# Patient Record
Sex: Female | Born: 1935
Health system: Southern US, Community
[De-identification: ages and names within clinical notes are randomized; demographics above are authoritative.]

## PROBLEM LIST (undated history)

## (undated) DIAGNOSIS — M5136 Other intervertebral disc degeneration, lumbar region: Secondary | ICD-10-CM

## (undated) DIAGNOSIS — F17200 Nicotine dependence, unspecified, uncomplicated: Secondary | ICD-10-CM

## (undated) DIAGNOSIS — E559 Vitamin D deficiency, unspecified: Secondary | ICD-10-CM

## (undated) DIAGNOSIS — E039 Hypothyroidism, unspecified: Secondary | ICD-10-CM

## (undated) DIAGNOSIS — I6523 Occlusion and stenosis of bilateral carotid arteries: Secondary | ICD-10-CM

## (undated) DIAGNOSIS — F329 Major depressive disorder, single episode, unspecified: Secondary | ICD-10-CM

## (undated) DIAGNOSIS — F0280 Dementia in other diseases classified elsewhere without behavioral disturbance: Secondary | ICD-10-CM

## (undated) DIAGNOSIS — R569 Unspecified convulsions: Secondary | ICD-10-CM

## (undated) DIAGNOSIS — E46 Unspecified protein-calorie malnutrition: Secondary | ICD-10-CM

## (undated) DIAGNOSIS — N3941 Urge incontinence: Secondary | ICD-10-CM

## (undated) DIAGNOSIS — E782 Mixed hyperlipidemia: Secondary | ICD-10-CM

## (undated) DIAGNOSIS — I771 Stricture of artery: Secondary | ICD-10-CM

## (undated) DIAGNOSIS — E538 Deficiency of other specified B group vitamins: Secondary | ICD-10-CM

## (undated) DIAGNOSIS — K219 Gastro-esophageal reflux disease without esophagitis: Secondary | ICD-10-CM

## (undated) DIAGNOSIS — M25569 Pain in unspecified knee: Secondary | ICD-10-CM

## (undated) DIAGNOSIS — G459 Transient cerebral ischemic attack, unspecified: Secondary | ICD-10-CM

## (undated) DIAGNOSIS — M40204 Unspecified kyphosis, thoracic region: Secondary | ICD-10-CM

## (undated) DIAGNOSIS — G8929 Other chronic pain: Secondary | ICD-10-CM

## (undated) DIAGNOSIS — D692 Other nonthrombocytopenic purpura: Secondary | ICD-10-CM

## (undated) HISTORY — DX: Other chronic pain: G89.29

## (undated) HISTORY — PX: TONSILLECTOMY: SUR1361

## (undated) HISTORY — DX: Other chronic pain: M25.569

## (undated) HISTORY — DX: Other intervertebral disc degeneration, lumbar region: M51.36

## (undated) HISTORY — DX: Urge incontinence: N39.41

## (undated) HISTORY — DX: Mixed hyperlipidemia: E78.2

## (undated) HISTORY — DX: Dementia in other diseases classified elsewhere, unspecified severity, without behavioral disturbance, psychotic disturbance, mood disturbance, and anxiety: F02.80

## (undated) HISTORY — DX: Other nonthrombocytopenic purpura: D69.2

## (undated) HISTORY — DX: Vitamin D deficiency, unspecified: E55.9

## (undated) HISTORY — DX: Occlusion and stenosis of bilateral carotid arteries: I65.23

## (undated) HISTORY — DX: Stricture of artery: I77.1

## (undated) HISTORY — DX: Deficiency of other specified B group vitamins: E53.8

## (undated) HISTORY — DX: Nicotine dependence, unspecified, uncomplicated: F17.200

## (undated) HISTORY — DX: Hypothyroidism, unspecified: E03.9

## (undated) HISTORY — DX: Unspecified protein-calorie malnutrition: E46

## (undated) HISTORY — DX: Gastro-esophageal reflux disease without esophagitis: K21.9

## (undated) HISTORY — DX: Unspecified convulsions: R56.9

## (undated) HISTORY — DX: Major depressive disorder, single episode, unspecified: F32.9

## (undated) HISTORY — DX: Unspecified kyphosis, thoracic region: M40.204

---

## 2003-09-29 ENCOUNTER — Encounter: Admission: RE | Admit: 2003-09-29 | Discharge: 2003-09-29 | Payer: Self-pay | Admitting: Emergency Medicine

## 2004-02-04 ENCOUNTER — Encounter: Admission: RE | Admit: 2004-02-04 | Discharge: 2004-02-04 | Payer: Self-pay | Admitting: Family Medicine

## 2004-12-05 ENCOUNTER — Encounter: Admission: RE | Admit: 2004-12-05 | Discharge: 2004-12-05 | Payer: Self-pay | Admitting: Family Medicine

## 2004-12-13 ENCOUNTER — Encounter: Admission: RE | Admit: 2004-12-13 | Discharge: 2004-12-13 | Payer: Self-pay | Admitting: Family Medicine

## 2005-06-26 ENCOUNTER — Encounter: Admission: RE | Admit: 2005-06-26 | Discharge: 2005-06-26 | Payer: Self-pay | Admitting: Family Medicine

## 2005-08-27 ENCOUNTER — Observation Stay (HOSPITAL_COMMUNITY): Admission: EM | Admit: 2005-08-27 | Discharge: 2005-08-28 | Payer: Self-pay | Admitting: Emergency Medicine

## 2006-09-07 IMAGING — CR DG THORACIC SPINE 3V
3 series · 3 of 3 positions shown · non-contrast
Comparison: none

CLINICAL DATA: Mid to low back and left hip pain. 
 LUMBAR SPINE ? 4 VIEW:

[t t-spine a.p.]
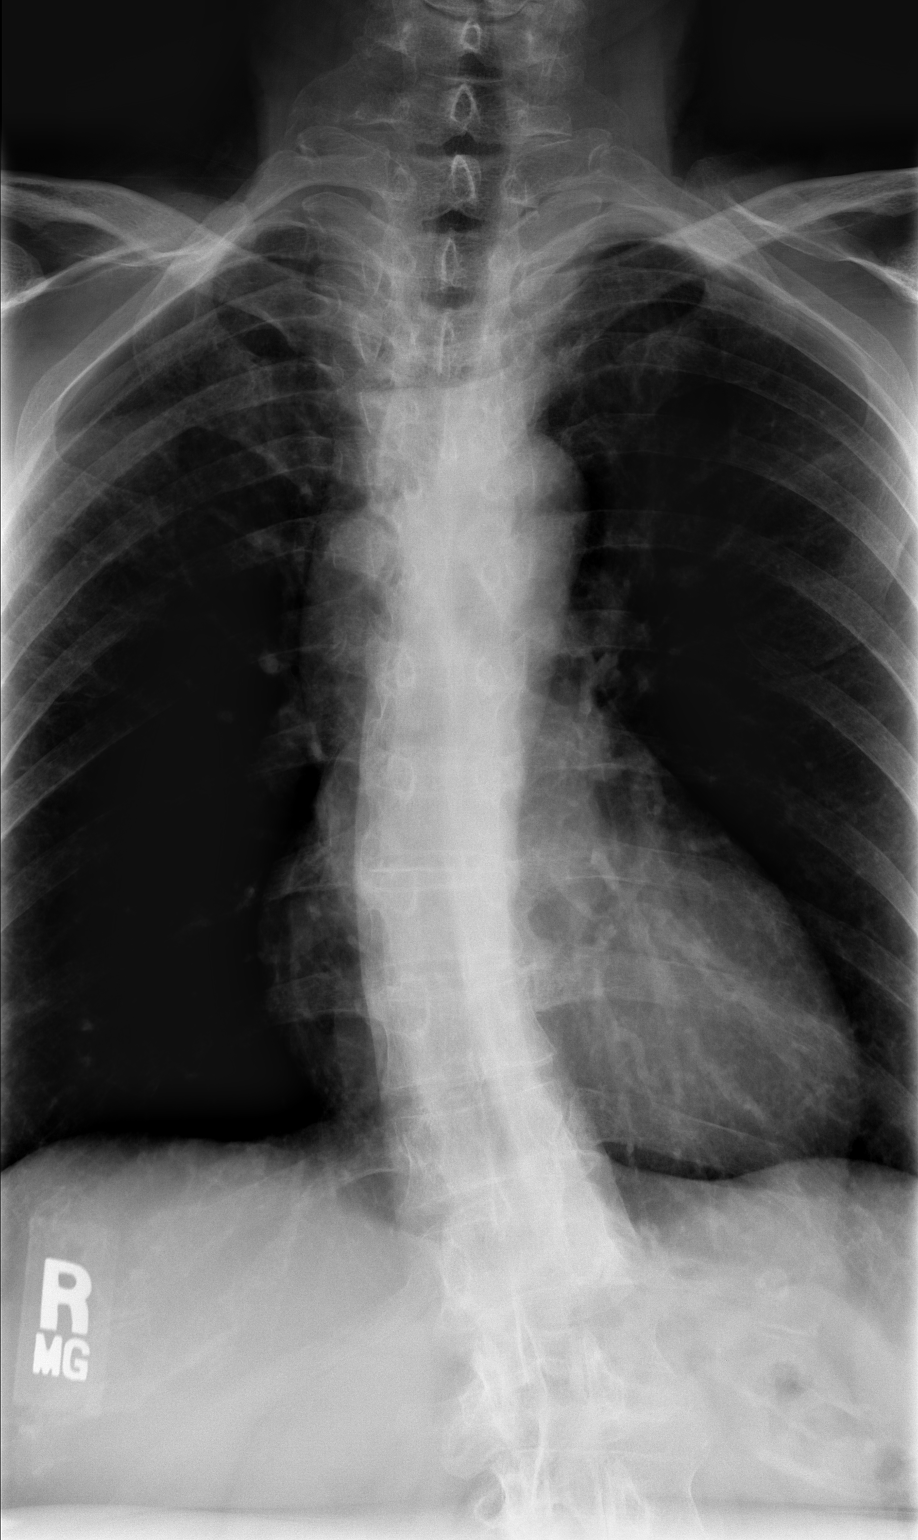

[t t-spine lat *]
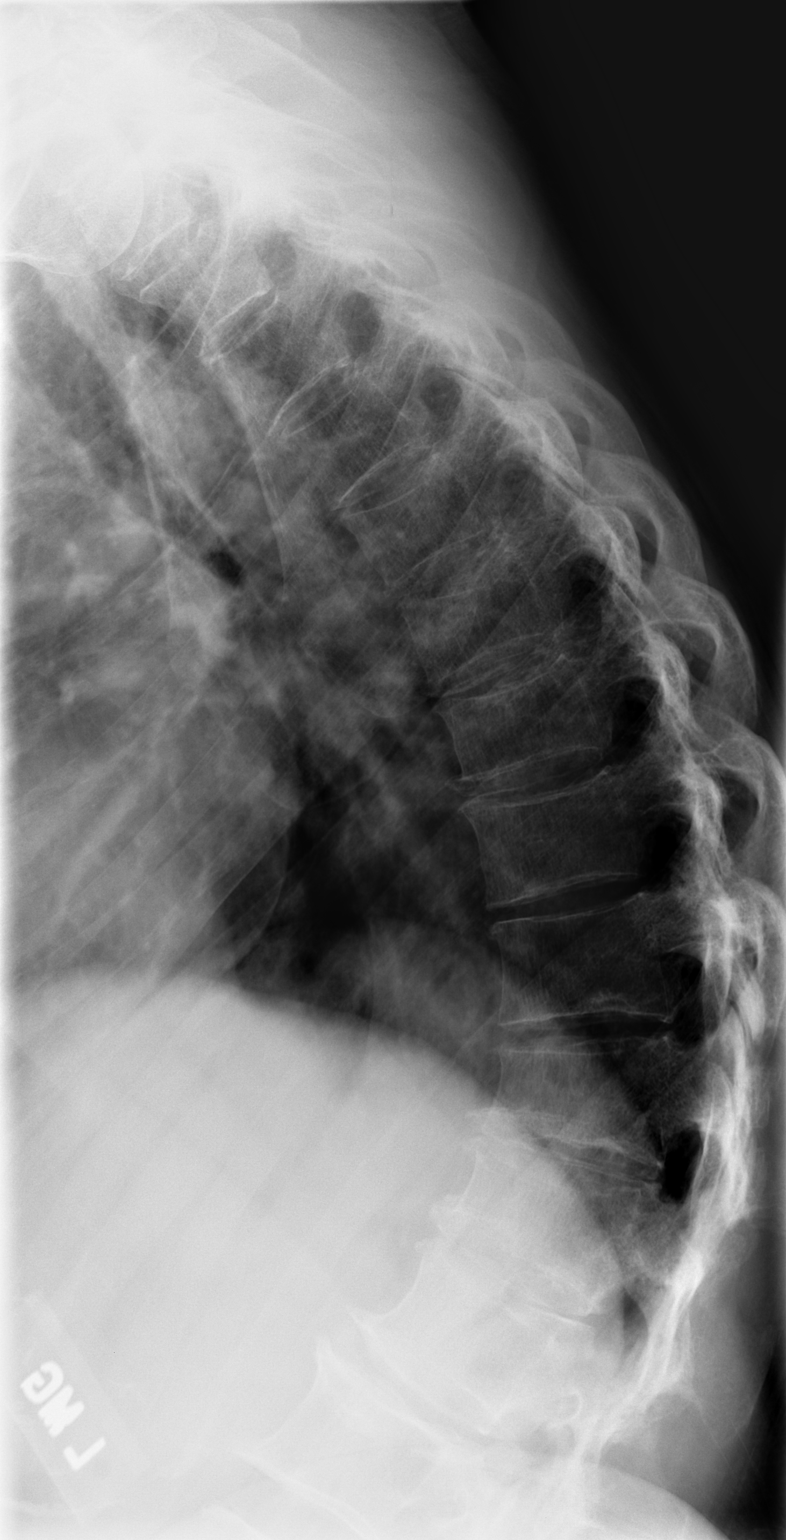

[t swimmers]
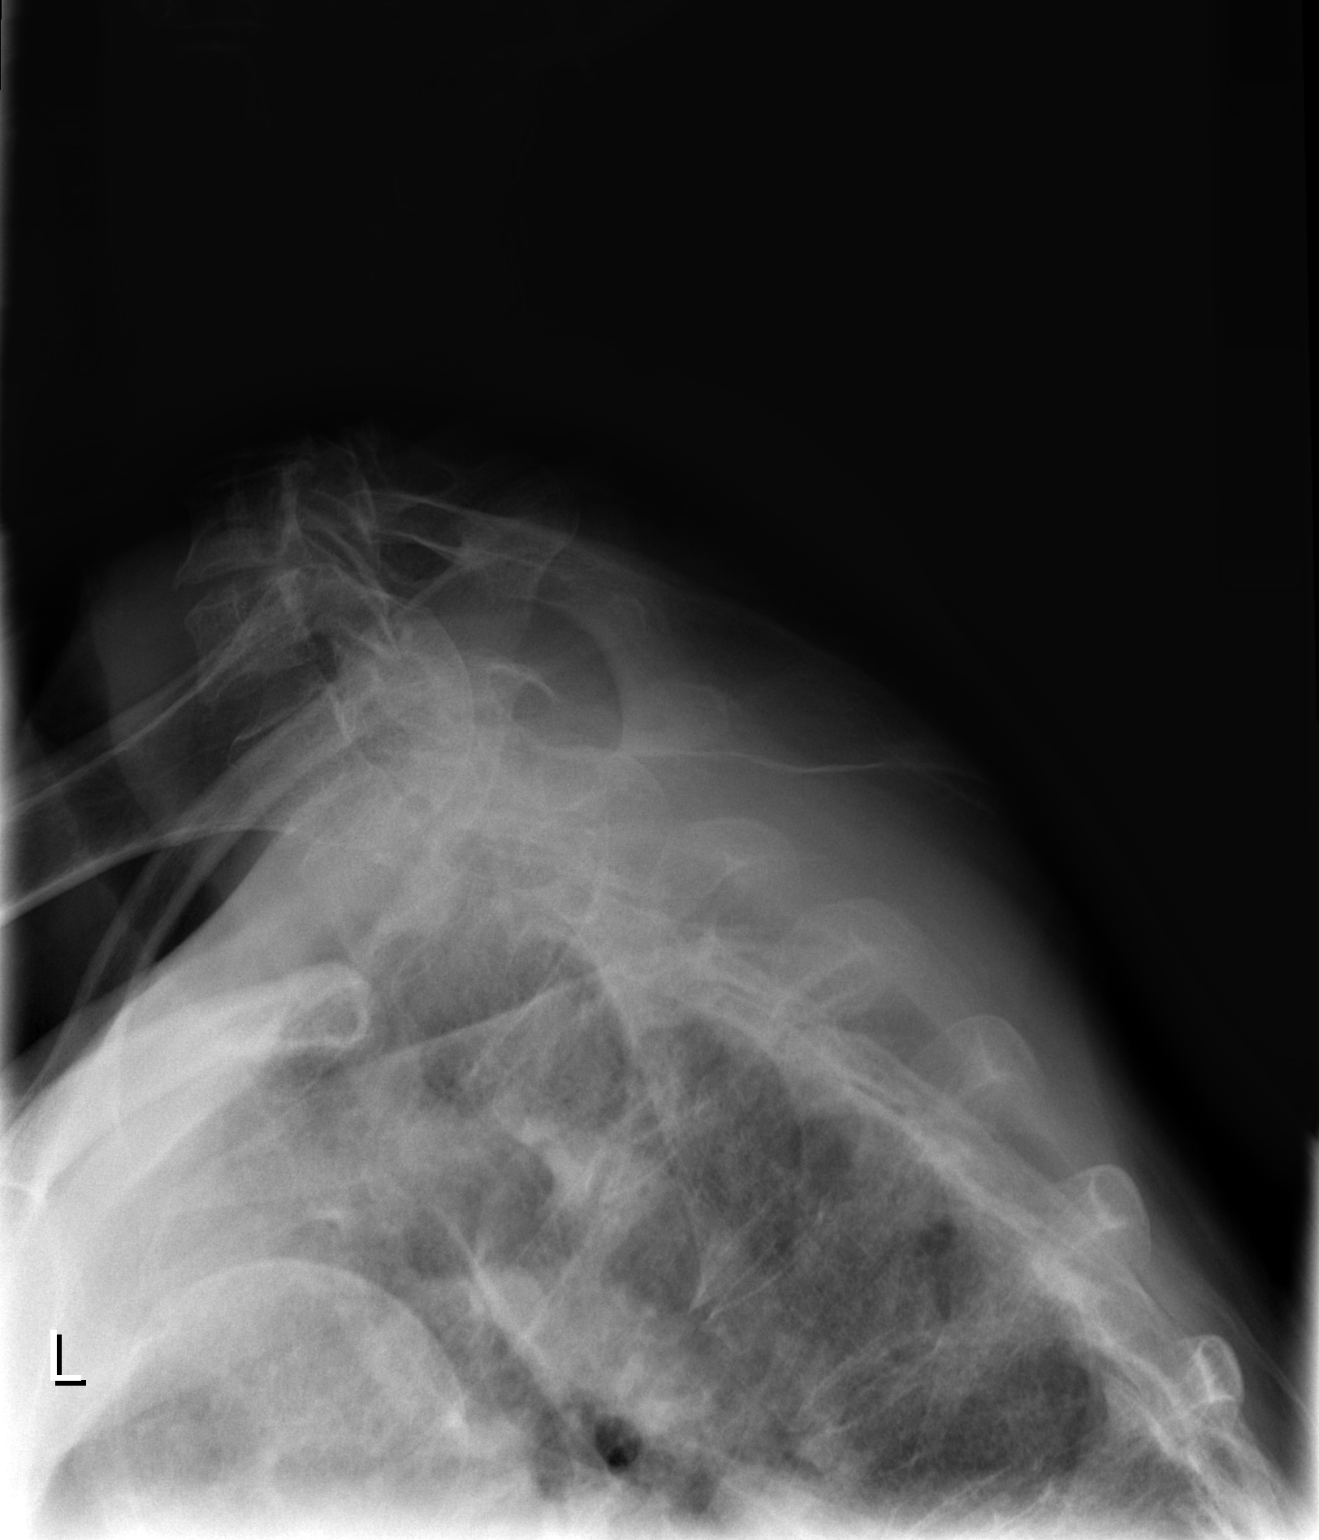

[3 of 3 positions shown; findings below may reference images not displayed]

FINDINGS: There is a rather marked levoscoliosis with a rotatory component in the upper lumbar region.  I do not see any obvious acute compression fractures or significantly narrowed disc spaces, although that assessment is difficult in the setting of scoliosis.  There is some moderate degenerative spurring of the upper lumbar vertebrae.  There is also at least some degree of facet arthropathy. 
 Soft tissues unremarkable.  The bones are mildly demineralized. 
 There are also some degenerative changes of the SI joints and of the right hip.  These are relatively mild.
IMPRESSION: 1.  Levoscoliosis with a rotary component in the upper lumbar region. 
 2.  Degenerative changes. 
 3.  No acute abnormality. 
 THORACIC SPINE - 2 VIEW:
 AP, lateral, and swimmers views were obtained.  There is scoliosis to the right.  No compression fractures or acute changes.  There are some degenerative changes of the lower thoracic spine with spurring and slight anterior wedging.   The pedicles are intact.  I see no obvious paraspinous masses.
IMPRESSION: Scoliosis with degenerative changes ? no acute findings.

## 2007-03-27 ENCOUNTER — Encounter: Admission: RE | Admit: 2007-03-27 | Discharge: 2007-03-27 | Payer: Self-pay | Admitting: Family Medicine

## 2007-04-10 ENCOUNTER — Encounter: Admission: RE | Admit: 2007-04-10 | Discharge: 2007-04-10 | Payer: Self-pay | Admitting: Family Medicine

## 2008-10-06 ENCOUNTER — Encounter: Admission: RE | Admit: 2008-10-06 | Discharge: 2008-10-06 | Payer: Self-pay | Admitting: Family Medicine

## 2009-05-14 ENCOUNTER — Encounter: Admission: RE | Admit: 2009-05-14 | Discharge: 2009-05-14 | Payer: Self-pay | Admitting: Family Medicine

## 2009-08-22 ENCOUNTER — Emergency Department (HOSPITAL_COMMUNITY): Admission: EM | Admit: 2009-08-22 | Discharge: 2009-08-22 | Payer: Self-pay | Admitting: Emergency Medicine

## 2009-11-18 ENCOUNTER — Encounter: Admission: RE | Admit: 2009-11-18 | Discharge: 2009-11-18 | Payer: Self-pay | Admitting: Family Medicine

## 2010-07-09 ENCOUNTER — Encounter: Payer: Self-pay | Admitting: Family Medicine

## 2010-07-10 ENCOUNTER — Encounter: Payer: Self-pay | Admitting: Family Medicine

## 2010-08-05 ENCOUNTER — Other Ambulatory Visit: Payer: Self-pay | Admitting: Family Medicine

## 2010-08-05 DIAGNOSIS — Z1231 Encounter for screening mammogram for malignant neoplasm of breast: Secondary | ICD-10-CM

## 2010-08-15 ENCOUNTER — Ambulatory Visit
Admission: RE | Admit: 2010-08-15 | Discharge: 2010-08-15 | Disposition: A | Discharge status: Home / Self Care | Payer: MEDICARE | Source: Ambulatory Visit | Attending: Family Medicine | Admitting: Family Medicine

## 2010-08-15 DIAGNOSIS — Z1231 Encounter for screening mammogram for malignant neoplasm of breast: Secondary | ICD-10-CM

## 2010-08-31 ENCOUNTER — Other Ambulatory Visit: Payer: Self-pay | Admitting: Family Medicine

## 2010-09-06 ENCOUNTER — Ambulatory Visit
Admission: RE | Admit: 2010-09-06 | Discharge: 2010-09-06 | Disposition: A | Discharge status: Home / Self Care | Payer: MEDICARE | Source: Ambulatory Visit | Attending: Family Medicine | Admitting: Family Medicine

## 2010-09-06 MED ORDER — IOHEXOL 300 MG/ML  SOLN
100.0000 mL | Freq: Once | INTRAMUSCULAR | Status: AC | PRN
  Administered 2010-09-06: 100 mL via INTRAVENOUS

## 2010-09-07 ENCOUNTER — Other Ambulatory Visit: Payer: MEDICARE

## 2010-09-11 LAB — URINALYSIS, ROUTINE W REFLEX MICROSCOPIC
Bilirubin Urine: NEGATIVE
Hgb urine dipstick: NEGATIVE
Protein, ur: NEGATIVE mg/dL
Specific Gravity, Urine: 1.015 (ref 1.005–1.030)
Urobilinogen, UA: 1 mg/dL (ref 0.0–1.0)

## 2010-09-11 LAB — DIFFERENTIAL
Basophils Absolute: 0.1 10*3/uL (ref 0.0–0.1)
Basophils Relative: 1 % (ref 0–1)
Eosinophils Relative: 4 % (ref 0–5)
Lymphocytes Relative: 15 % (ref 12–46)
Lymphs Abs: 1 10*3/uL (ref 0.7–4.0)
Monocytes Absolute: 0.7 10*3/uL (ref 0.1–1.0)
Monocytes Relative: 10 % (ref 3–12)

## 2010-09-11 LAB — COMPREHENSIVE METABOLIC PANEL
AST: 25 U/L (ref 0–37)
Alkaline Phosphatase: 80 U/L (ref 39–117)
BUN: 11 mg/dL (ref 6–23)
CO2: 27 mEq/L (ref 19–32)
GFR calc Af Amer: >60 mL/min (ref >60)
Potassium: 4 mEq/L (ref 3.5–5.1)
Sodium: 140 mEq/L (ref 135–145)
Total Protein: 6.4 g/dL (ref 6.0–8.3)

## 2010-09-11 LAB — URINE CULTURE: Colony Count: 15000

## 2010-09-11 LAB — CBC
HCT: 42 % (ref 36.0–46.0)
MCV: 93 fL (ref 78.0–100.0)
RBC: 4.52 MIL/uL (ref 3.87–5.11)

## 2010-09-11 LAB — URINE MICROSCOPIC-ADD ON

## 2010-10-31 ENCOUNTER — Other Ambulatory Visit: Payer: MEDICARE

## 2010-10-31 ENCOUNTER — Other Ambulatory Visit: Payer: Self-pay

## 2010-10-31 ENCOUNTER — Other Ambulatory Visit: Payer: Self-pay | Admitting: Family Medicine

## 2010-10-31 ENCOUNTER — Ambulatory Visit (HOSPITAL_COMMUNITY)
Admission: RE | Admit: 2010-10-31 | Discharge: 2010-10-31 | Disposition: A | Discharge status: Home / Self Care | Payer: Medicare Other | Source: Ambulatory Visit | Attending: Family Medicine | Admitting: Family Medicine

## 2010-10-31 DIAGNOSIS — M7989 Other specified soft tissue disorders: Secondary | ICD-10-CM | POA: Insufficient documentation

## 2010-10-31 DIAGNOSIS — R609 Edema, unspecified: Secondary | ICD-10-CM

## 2010-10-31 DIAGNOSIS — R52 Pain, unspecified: Secondary | ICD-10-CM

## 2010-10-31 DIAGNOSIS — M79609 Pain in unspecified limb: Secondary | ICD-10-CM

## 2010-11-04 NOTE — H&P (Signed)
[name], [name]              ACCOUNT NO.:  [id number]   MEDICAL RECORD NO.:  [id number]          PATIENT TYPE:  EMS   LOCATION:  ED                           FACILITY:  [facility]   PHYSICIAN:  Melissa L. Ladona Ridgel, MD  DATE OF BIRTH:  11-22-1931   DATE OF ADMISSION:  08/27/2005  DATE OF DISCHARGE:                                HISTORY & PHYSICAL   CHIEF COMPLAINT:  Numbness and weakness of left arm and leg with left facial  numbness.   PRIMARY CARE PHYSICIAN:  Dr. Cloyde Reams and the patient has seen Dr.  Orlin Benitez from [facility] in the past.   HISTORY OF PRESENT ILLNESS:  The patient is a 77 year old white female who  states that at approximately 7:00 p.m. tonight she developed the onset of  left hand numbness and heaviness for which she was trying to shake the hand  out and improved the symptoms. She states that sensation progressed to her  left leg which felt like she was dragging it. She then developed left lip  numbness and in speaking with her daughter about the current symptoms, her  daughter recommended that she come to the emergency room and therefore she  was brought to the ED for further evaluation. In the emergency room, the  patient her daughter state that her symptoms continued, however, shortly  thereafter resolved with no significant intervention.   [facility] hospitalist was consulted after resolution of symptoms for admission  to the general medical service for TIA.   REVIEW OF SYSTEMS:  The patient has been complaining of on and off chest  discomfort, her last episode was today earlier in the day. She states that  it is usually crampy in nature, it resolves by itself with no intervention  and is not associated with any shortness of breath or sweating. Further  review of symptoms revealed no fever, chills, nausea, vomiting, diarrhea or  dizziness. All other review of systems appeared negative.   PAST MEDICAL HISTORY:  She has had two strokes though the  last one was a  year and a half ago. She states that for the first year after her stroke she  was placed on Plavix, this was discontinued and she was continued on aspirin  therapy. The emergency room notes states that she is on 81 mg however, the  patient states that she takes a full adult dose each day. She also has  mitral valve disease, no diabetes, no hypertension. She had thoracic outlet  syndrome as a child.   PAST SURGICAL HISTORY:  She had cervical vertebrae remove to treat her  thoracic outlet syndrome on the right and she had a cholecystectomy and  appendectomy.   SOCIAL HISTORY:  She smokes a pack a day of cigarettes. She does not drink.  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   FAMILY HISTORY:  Mom is living in her 75s with Parkinson's disease, dementia  and a history of hip fracture. Dad is deceased. He had valve replacement  with the passage of clots.   ALLERGIES:  CODEINE which causes  her to feel sick and itchy.   MEDICATIONS:  1.  Aspirin 81 mg.  2.  Synthroid, she takes the blue in Green tablet. We will call [facility] on      [address] in the morning to clarify the medication dose.   PHYSICAL EXAMINATION:  VITAL SIGNS: Temperature is 97.9, blood pressure  120/68, pulse 65, respirations 18, saturations 98%.  GENERAL: The patient is in no acute distress. She states she has no current  symptoms of any numbness or tingling, but remains a little bit weak in the  left upper extremity. She is normocephalic, atraumatic.  HEENT:  Pupils equal, round, reactive to light. Extraocular muscles intact.  Mucous membranes are moist.  NECK:  Supple. There is no JVD, no lymph nodes, no carotid bruits. She has a  well healed right supraclavicular scar.  CHEST:  Clear to auscultation. There is no rhonchi, rales or wheezes.  CARDIOVASCULAR:  Regular rate and rhythm. Positive S1, S2. No S3, S4.  ABDOMEN:  Soft, nontender, nondistended with positive bowel  sounds.  EXTREMITIES:  Show no clubbing, cyanosis or edema.  NEUROLOGIC:  Neurologically cranial nerves II-XII appear to be intact. Power  is 5/5 in the right upper and right lower extremities. Left upper extremity  and left lower extremity appear to have a power of 4/5 with weakness in the  reduction of her fingers on the left. DTRs appear to be 2 to 2+ bilaterally  in all extremities. Plantars were downgoing bilaterally. Sensation is  grossly intact.   LABORATORY DATA:  Laboratories reveal a white count of 5.7, hemoglobin 13,  hematocrit 39.1, platelets of 236. Her sodium is 140, potassium 3.6,  chloride 105, CO2 is 27, BUN is 12, creatinine is 1.0, glucose 115, calcium  is 8.9. Her EKG shows sinus bradycardia at 57 with no ST-T wave changes.   ASSESSMENT/PLAN:  This is a 77 year old white female with a history of TIA  and stroke. She presented with transient left upper extremity, left lower  extremity numbness and left lip numbness. She states that the symptoms are  currently resolved but she remains with left upper extremity weakness.   IMPRESSION:  1.  Neuro.  Transient ischemic attack versus cerebrovascular accident. The      patient is already on full dose aspirin at home. With her last  CVA, she      was on Plavix for 1 year. In light of the fact that she is having chest      pain, I will hold off on the Aggrenox and start her on Plavix. I will      request neurology's second opinion on the antiplatelet of choice in the      a.m.. Will check MRI, MRA of the head and neck. IV hydrate her slightly      since her blood pressure is on the lower side. At this time, she passed      her speech evaluation so she can have her full diet.  2.  Cardiovascular.  Sinus bradycardia. Will check her TSH and will check      with [facility] in the morning in terms of the dosage of her Synthroid.      Will also check a fasting lipid panel and as stated a 2-D echo with     cardiac markers because of  the recent complaints of on and off chest      pain.  3.  GI. She has no evidence for dysphasia. I will place her  on a heart      healthy diet.  4.  GU.  She has no current complaints but will check a UA culture and      sensitivity  5.  Endocrine. She is hypothyroid. As stated will check on her dosing for      her Synthroid and resume that.  6.  Code status. At this time, the patient and her daughter are quite      anxious and not in a frame of mind to speak about the patient's code      status. The patient is stable. I will recommend to my partner discussing      this in the morning.      Melissa L. Ladona Ridgel, MD  Electronically Signed     MLT/MEDQ  D:  08/27/2005  T:  08/28/2005  Job:  [id number]   cc:   Christina Benitez, M.D.  Fax: [phone]   Schuyler Amor, M.D.  Fax: [phone]

## 2010-11-04 NOTE — Discharge Summary (Signed)
NAMEMARKIYA, [name]              ACCOUNT NO.:  [id number]   MEDICAL RECORD NO.:  [id number]          PATIENT TYPE:  INP   LOCATION:  1420                         FACILITY:  [facility]   PHYSICIAN:  Corinna L. Lendell Caprice, MDDATE OF BIRTH:  [birth year]   DATE OF ADMISSION:  08/27/2005  DATE OF DISCHARGE:  08/28/2005                                 DISCHARGE SUMMARY   DISCHARGE DIAGNOSES:  1.  Transient ischemic attack.  2.  Hyperlipidemia.  3.  Tobacco abuse, counseled against.  4.  Hypothyroidism.   DISCHARGE MEDICATIONS:  1.  She is to stop aspirin and take Aggrenox 1 p.o. daily for 7 days and      then b.i.d.  2.  Lipitor 20 mg p.o. q.h.s.   DIET:  Should be low-cholesterol.   She is to avoid cigarettes.   She is to follow up with Dr. Orlin Benitez within one week.  She will need  followup of her echocardiogram.  She is to follow up with Dr. Julian Reil in 4-6  weeks.  She will need repeat fasting lipids and liver function tests in  about three months.   CONDITION:  Stable.   ACTIVITY:  Ad lib.   CONSULTATIONS:  None.   PROCEDURES:  None.   PERTINENT LABORATORY:  CBC unremarkable.  BMET unremarkable.  Cardiac  enzymes negative.  Total cholesterol 213, triglycerides 58, HDL 61, LDL 140.  TSH 10.  Homocysteine 15.4, which is high normal.   SPECIAL STUDIES AND RADIOLOGY:  MRI of the brain shows no acute findings.  She had mild enhancement of the dura throughout.  Remote left caudate head  and corona radiata infarct.  MRA of the neck showed no significant carotid  disease.  CT of the brain showed nothing acute.  EKG shows sinus  bradycardia.   HISTORY AND HOSPITAL COURSE:  Christina Benitez is a pleasant 77 year old white  female patient of Dr. Orlin Benitez and Dr. Julian Reil who presented to the emergency  room with left arm numbness and weakness.  She also had left facial numbness  and left leg numbness and weakness.  It started at 7 p.m.  By the time Dr.  Derenda Mis did the history and  physical, her symptoms had resolved.  Apparently, they resolved soon after she arrived in the emergency room.  She  is on an aspirin a day.  She had a stroke in the past and apparently had  been on Plavix in the past.  Currently, however, she takes an aspirin a day.  She continues to smoke.  She was admitted to telemetry where she remained in  normal sinus rhythm.  She had no return of her symptoms.  She was extremely  anxious to be discharged because there is no one to care for her elderly  mother.  She nearly left against medical advice on the evening of the 11th.  I did, however, convince her to stay, but she is unable to stay any longer.  She has an echocardiogram that will need to be followed up.  I have started  her on Zocor and Aggrenox.  I have spoken with  Dr. Orlin Benitez, who agrees to  see the patient in followup.  Please see H&P for complete details.  She had  a nonfocal exam on admission, and her vital signs were normal.   She also had atypical chest pain, which she had been having for many  years.  Apparently, she has had this worked up in the past.  She ruled out  for myocardial infarction, and she had a normal EKG.      Corinna L. Lendell Caprice, MD  Electronically Signed     CLS/MEDQ  D:  08/28/2005  T:  08/29/2005  Job:  [id number]   cc:   Christina Benitez, M.D.  Fax: [phone]   Christina Benitez, M.D.  Fax: [phone]

## 2011-06-26 ENCOUNTER — Ambulatory Visit
Admission: RE | Admit: 2011-06-26 | Discharge: 2011-06-26 | Disposition: A | Discharge status: Home / Self Care | Payer: Medicare Other | Source: Ambulatory Visit | Attending: Family Medicine | Admitting: Family Medicine

## 2011-06-26 ENCOUNTER — Other Ambulatory Visit: Payer: Self-pay | Admitting: Family Medicine

## 2011-06-26 DIAGNOSIS — J209 Acute bronchitis, unspecified: Secondary | ICD-10-CM

## 2012-08-06 ENCOUNTER — Other Ambulatory Visit: Payer: Self-pay | Admitting: Family Medicine

## 2012-08-06 DIAGNOSIS — Z1231 Encounter for screening mammogram for malignant neoplasm of breast: Secondary | ICD-10-CM

## 2012-08-30 ENCOUNTER — Ambulatory Visit
Admission: RE | Admit: 2012-08-30 | Discharge: 2012-08-30 | Disposition: A | Discharge status: Home / Self Care | Payer: Medicare Other | Source: Ambulatory Visit | Attending: Family Medicine | Admitting: Family Medicine

## 2012-08-30 DIAGNOSIS — Z1231 Encounter for screening mammogram for malignant neoplasm of breast: Secondary | ICD-10-CM

## 2013-12-14 ENCOUNTER — Emergency Department (HOSPITAL_COMMUNITY): Payer: Medicare Other

## 2013-12-14 ENCOUNTER — Observation Stay (HOSPITAL_COMMUNITY)
Admission: EM | Admit: 2013-12-14 | Discharge: 2013-12-15 | Disposition: A | Discharge status: Home / Self Care | Payer: Medicare Other | Attending: Internal Medicine | Admitting: Internal Medicine

## 2013-12-14 ENCOUNTER — Encounter (HOSPITAL_COMMUNITY): Payer: Self-pay | Admitting: Emergency Medicine

## 2013-12-14 DIAGNOSIS — E039 Hypothyroidism, unspecified: Secondary | ICD-10-CM

## 2013-12-14 DIAGNOSIS — Z8673 Personal history of transient ischemic attack (TIA), and cerebral infarction without residual deficits: Secondary | ICD-10-CM | POA: Insufficient documentation

## 2013-12-14 DIAGNOSIS — F17200 Nicotine dependence, unspecified, uncomplicated: Secondary | ICD-10-CM | POA: Insufficient documentation

## 2013-12-14 DIAGNOSIS — Z79899 Other long term (current) drug therapy: Secondary | ICD-10-CM | POA: Insufficient documentation

## 2013-12-14 DIAGNOSIS — Z7982 Long term (current) use of aspirin: Secondary | ICD-10-CM | POA: Insufficient documentation

## 2013-12-14 DIAGNOSIS — R112 Nausea with vomiting, unspecified: Secondary | ICD-10-CM | POA: Insufficient documentation

## 2013-12-14 DIAGNOSIS — R55 Syncope and collapse: Secondary | ICD-10-CM

## 2013-12-14 DIAGNOSIS — R4182 Altered mental status, unspecified: Principal | ICD-10-CM

## 2013-12-14 HISTORY — DX: Transient cerebral ischemic attack, unspecified: G45.9

## 2013-12-14 LAB — CBC WITH DIFFERENTIAL/PLATELET
Basophils Absolute: 0 10*3/uL (ref 0.0–0.1)
Basophils Relative: 1 % (ref 0–1)
Eosinophils Absolute: 0.4 10*3/uL (ref 0.0–0.7)
Eosinophils Relative: 7 % — ABNORMAL HIGH (ref 0–5)
HCT: 40.5 % (ref 36.0–46.0)
Hemoglobin: 13.4 g/dL (ref 12.0–15.0)
Lymphocytes Relative: 24 % (ref 12–46)
Lymphs Abs: 1.5 10*3/uL (ref 0.7–4.0)
MCH: 30.5 pg (ref 26.0–34.0)
MCHC: 33.1 g/dL (ref 30.0–36.0)
MCV: 92 fL (ref 78.0–100.0)
Monocytes Absolute: 0.7 10*3/uL (ref 0.1–1.0)
Monocytes Relative: 12 % (ref 3–12)
Neutro Abs: 3.5 10*3/uL (ref 1.7–7.7)
Neutrophils Relative %: 56 % (ref 43–77)
Platelets: 197 10*3/uL (ref 150–400)
RBC: 4.4 MIL/uL (ref 3.87–5.11)
RDW: 13.6 % (ref 11.5–15.5)
WBC: 6.1 10*3/uL (ref 4.0–10.5)

## 2013-12-14 LAB — BASIC METABOLIC PANEL
BUN: 13 mg/dL (ref 6–23)
CO2: 26 mEq/L (ref 19–32)
Calcium: 8.9 mg/dL (ref 8.4–10.5)
Chloride: 101 mEq/L (ref 96–112)
Creatinine, Ser: 0.83 mg/dL (ref 0.50–1.10)
GFR calc Af Amer: 76 mL/min — ABNORMAL LOW (ref >90)
GFR calc non Af Amer: 65 mL/min — ABNORMAL LOW (ref >90)
Glucose, Bld: 104 mg/dL — ABNORMAL HIGH (ref 70–99)
Potassium: 4 mEq/L (ref 3.7–5.3)
Sodium: 141 mEq/L (ref 137–147)

## 2013-12-14 LAB — URINALYSIS, ROUTINE W REFLEX MICROSCOPIC
Bilirubin Urine: NEGATIVE
Glucose, UA: NEGATIVE mg/dL
Hgb urine dipstick: NEGATIVE
Ketones, ur: 15 mg/dL — AB
Nitrite: NEGATIVE
Protein, ur: NEGATIVE mg/dL
Specific Gravity, Urine: 1.025 (ref 1.005–1.030)
Urobilinogen, UA: 0.2 mg/dL (ref 0.0–1.0)
pH: 5.5 (ref 5.0–8.0)

## 2013-12-14 LAB — URINE MICROSCOPIC-ADD ON

## 2013-12-14 LAB — I-STAT TROPONIN, ED: TROPONIN I, POC: 0.01 ng/mL (ref 0.00–0.08)

## 2013-12-14 MED ORDER — ONDANSETRON HCL 4 MG/2ML IJ SOLN
4.0000 mg | Freq: Once | INTRAMUSCULAR | Status: AC
  Administered 2013-12-14: 4 mg via INTRAVENOUS
  Filled 2013-12-14: qty 2

## 2013-12-14 MED ORDER — SODIUM CHLORIDE 0.9 % IV SOLN
INTRAVENOUS | Status: DC
  Administered 2013-12-14: 19:00:00 via INTRAVENOUS

## 2013-12-14 NOTE — ED Provider Notes (Signed)
CSN: [id number]     Arrival date & time 12/14/13  1755 History   First MD Initiated Contact with Patient 12/14/13 2003     Chief Complaint  Patient presents with  . Nausea  . Weakness    (Consider location/radiation/quality/duration/timing/severity/associated sxs/prior Treatment) HPI Comments: Patient is a 80 year old female with a history of TIA who presents to the emergency department today for sudden onset weakness. Patient states that she was sitting at a restaurant at approximately 5 PM when she suddenly started to feel nauseous. Family states that patient became lethargic, or her head tilted backwards with her mouth open and her eyes rolled to the back of her head. This lasted for a few minutes before spontaneously resolving after which time the patient vomited x1. Patient states that when she regained consciousness she felt as though she was out of her body and unable to identify any of the people around her. Family denies any known facial drooping or unilateral weakness/paralysis. No known slurred speech. No whole body shaking. Patient denies vision changes, hearing changes, chest pain, shortness of breath, abdominal pain, numbness/paresthesias, and extremity weakness.   She denies the use of blood thinners. She states that her mother may have had a seizure in the past. Patient denies any personal seizure history. Patient lives alone in a house.  Patient is a 80 y.o. female presenting with weakness. The history is provided by the patient. No language interpreter was used.  Weakness Associated symptoms include nausea, vomiting and weakness. Pertinent negatives include no abdominal pain, chest pain, fever or numbness.    Past Medical History  Diagnosis Date  . TIA (transient ischemic attack)    Past Surgical History  Procedure Laterality Date  . Tonsillectomy     No family history on file. History  Substance Use Topics  . Smoking status: Current Every Day Smoker -- 0.50 packs/day     Types: Cigarettes  . Smokeless tobacco: Not on file  . Alcohol Use: No   OB History   Grav Para Term Preterm Abortions TAB SAB Ect Mult Living                  Review of Systems  Constitutional: Negative for fever.  Respiratory: Negative for shortness of breath.   Cardiovascular: Negative for chest pain.  Gastrointestinal: Positive for nausea and vomiting. Negative for abdominal pain.  Neurological: Positive for weakness. Negative for syncope and numbness.  All other systems reviewed and are negative.    Allergies  Sulfa antibiotics  Home Medications   Prior to Admission medications   Medication Sig Start Date End Date Taking? Authorizing Provider  aspirin 81 MG chewable tablet Chew 81 mg by mouth daily.   Yes Historical Provider, MD  citalopram (CELEXA) 40 MG tablet Take 40 mg by mouth daily. 11/28/13  Yes Historical Provider, MD  cyclobenzaprine (FLEXERIL) 10 MG tablet Take 10 mg by mouth 3 (three) times daily as needed for muscle spasms.   Yes Historical Provider, MD  HYDROcodone-acetaminophen (NORCO) 10-325 MG per tablet Take 1 tablet by mouth 2 (two) times daily as needed. For pain 11/27/13  Yes Historical Provider, MD  levothyroxine (SYNTHROID, LEVOTHROID) 150 MCG tablet Take 150 mcg by mouth daily. 11/28/13  Yes Historical Provider, MD  Vitamin D, Cholecalciferol, 1000 UNITS TABS Take 1,000 Units by mouth daily.   Yes Historical Provider, MD   BP 123/54  Pulse 71  Temp(Src) 97.9 F (36.6 C) (Oral)  Resp 18  Ht 5\' 8"  (1.727 m)  Wt 150 lb (68.04 kg)  BMI 22.81 kg/m2  SpO2 99%  Physical Exam  Nursing note and vitals reviewed. Constitutional: She is oriented to person, place, and time. She appears well-developed and well-nourished. No distress.  Nontoxic/nonseptic appearing  HENT:  Head: Normocephalic and atraumatic.  Eyes: Conjunctivae and EOM are normal. No scleral icterus.  Neck: Normal range of motion.  Cardiovascular: Normal rate, regular rhythm and normal  heart sounds.   Pulmonary/Chest: Effort normal and breath sounds normal. No respiratory distress. She has no wheezes. She has no rales.  Abdominal: Soft. She exhibits no distension. There is no tenderness. There is no rebound and no guarding.  Soft, nontender.  Musculoskeletal: Normal range of motion.  Neurological: She is alert and oriented to person, place, and time. She has normal reflexes. No cranial nerve deficit. She exhibits normal muscle tone. Coordination normal.  GCS 15. Patient speaks in full goal oriented sentences. No cranial nerve deficits appreciated; symmetric eyebrow raise, no facial drooping, equal tongue protrusion. Patient has normal grip strength and strength against resistance bilaterally. No gross sensory deficits appreciated. No pronator drift. Finger to nose intact. DTRs normal and symmetric.  Skin: Skin is warm and dry. No rash noted. She is not diaphoretic. No erythema. No pallor.  Psychiatric: She has a normal mood and affect. Her behavior is normal.    ED Course  Procedures (including critical care time) Labs Review Labs Reviewed  CBC WITH DIFFERENTIAL - Abnormal; Notable for the following:    Eosinophils Relative 7 (*)    All other components within normal limits  BASIC METABOLIC PANEL - Abnormal; Notable for the following:    Glucose, Bld 104 (*)    GFR calc non Af Amer 65 (*)    GFR calc Af Amer 76 (*)    All other components within normal limits  URINALYSIS, ROUTINE W REFLEX MICROSCOPIC - Abnormal; Notable for the following:    Ketones, ur 15 (*)    Leukocytes, UA SMALL (*)    All other components within normal limits  URINE MICROSCOPIC-ADD ON  I-STAT TROPOININ, ED    Imaging Review Ct Head Wo Contrast  12/14/2013   CLINICAL DATA:  Nausea and weakness  EXAM: CT HEAD WITHOUT CONTRAST  TECHNIQUE: Contiguous axial images were obtained from the base of the skull through the vertex without intravenous contrast.  COMPARISON:  10/06/2008  FINDINGS: Skull and  Sinuses:No fracture or destructive process.  Orbits: Bilateral cataract resection  Brain: No evidence of acute abnormality, such as acute infarction, hemorrhage, hydrocephalus, or mass lesion/mass effect. There is generalized cerebral volume loss which has mildly progressed from 2010. Mild small vessel disease with ischemic gliosis seen around the lateral ventricles. Chronic calcification in the fourth ventricle.  IMPRESSION: 1. No acute intracranial findings. 2. Brain atrophy and mild small vessel disease.   Electronically Signed   By: Tiburcio Pea M.D.   On: 12/14/2013 23:53     EKG Interpretation None      MDM   Final diagnoses:  Altered mental status, unspecified altered mental status type    80 year old female with a history of TIA presents to the emergency department for transient altered mental status. Family initially reports patient's eyes rolled to the back of her head. He later reported to the nurse that she had a piece to her right and then became unresponsive. Daughter states that patient had same episode that day while riding in the car with another family member.  Neurologic exam today is nonfocal. Patient  has been mentating at baseline since arrival. No history of seizure activity, though patient states that her mother may have had history of seizure activity. No documented facial drooping, slurred speech, or unilateral weakness. Workup today is unremarkable. CT head without any acute intracranial findings. Will admit to [facility] for further evaluation of altered mental status. Suspect syncope versus seizure. Dr. Amada JupiterKirkpatrick of neurology to consult. Temp admit orders placed.   Filed Vitals:   12/14/13 2230 12/14/13 2245 12/14/13 2337 12/14/13 2341  BP: 114/44 121/63 123/54   Pulse: 52 72 71 71  Temp:      TempSrc:      Resp: 18 16 16 18   Height:      Weight:      SpO2: 96% 97% 96% 99%     Antony MaduraKelly Humes, PA-C 12/15/13 0021

## 2013-12-14 NOTE — ED Notes (Signed)
Pt family at bedside, per daughter Christina Benitez, pt was sitting eating on Monday December 07, 2013 when "she started to gaze to her right and became unresponsive. Pt would not answer questions and when she snapped out of it she was confused." Pt daughter states pt had same episode in same day while riding in the car with another family member. Family denies any hx of seizures. States previous stroke symptoms have been different than what pt presents with.

## 2013-12-14 NOTE — ED Notes (Signed)
Per EMS: Pt was at restaurant at 1712 when she had sudden onset of weakness from "toes up to head". Pt sat down and family states pt became lethargic, denies any LOC. EMS reports no neuro deficits, denies any slurred speech. Pt also reports nausea, one episode of emesis with EMS. Pt given 4mg  of zofran en route. Pt with hx of TIA in past. Nad noted. Pt axo x4.

## 2013-12-14 NOTE — ED Notes (Signed)
Called CT in regards to pt transportation, states she will be taken for CT soon.

## 2013-12-15 ENCOUNTER — Inpatient Hospital Stay (HOSPITAL_COMMUNITY): Payer: Medicare Other

## 2013-12-15 DIAGNOSIS — R55 Syncope and collapse: Secondary | ICD-10-CM

## 2013-12-15 DIAGNOSIS — R4182 Altered mental status, unspecified: Principal | ICD-10-CM

## 2013-12-15 DIAGNOSIS — E039 Hypothyroidism, unspecified: Secondary | ICD-10-CM

## 2013-12-15 MED ORDER — CYCLOBENZAPRINE HCL 10 MG PO TABS: 5.0000 mg | ORAL_TABLET | Freq: Three times a day (TID) | ORAL | Status: DC | PRN

## 2013-12-15 MED ORDER — LEVOTHYROXINE SODIUM 150 MCG PO TABS
150.0000 ug | ORAL_TABLET | Freq: Every day | ORAL | Status: DC
  Administered 2013-12-15: 150 ug via ORAL
  Filled 2013-12-15 (×2): qty 1

## 2013-12-15 MED ORDER — HYDROCODONE-ACETAMINOPHEN 10-325 MG PO TABS: 0.5000 | ORAL_TABLET | Freq: Two times a day (BID) | ORAL | Status: AC | PRN

## 2013-12-15 MED ORDER — CITALOPRAM HYDROBROMIDE 40 MG PO TABS
40.0000 mg | ORAL_TABLET | Freq: Every day | ORAL | Status: DC
  Administered 2013-12-15: 40 mg via ORAL
  Filled 2013-12-15: qty 1

## 2013-12-15 MED ORDER — CYCLOBENZAPRINE HCL 10 MG PO TABS
10.0000 mg | ORAL_TABLET | Freq: Three times a day (TID) | ORAL | Status: DC | PRN
  Filled 2013-12-15: qty 1

## 2013-12-15 MED ORDER — HEPARIN SODIUM (PORCINE) 5000 UNIT/ML IJ SOLN
5000.0000 [IU] | Freq: Three times a day (TID) | INTRAMUSCULAR | Status: DC
  Filled 2013-12-15 (×4): qty 1

## 2013-12-15 MED ORDER — HYDROCODONE-ACETAMINOPHEN 10-325 MG PO TABS: 1.0000 | ORAL_TABLET | Freq: Two times a day (BID) | ORAL | Status: DC | PRN

## 2013-12-15 MED ORDER — ASPIRIN 81 MG PO CHEW
81.0000 mg | CHEWABLE_TABLET | Freq: Every day | ORAL | Status: DC
  Administered 2013-12-15: 81 mg via ORAL
  Filled 2013-12-15: qty 1

## 2013-12-15 NOTE — ED Notes (Signed)
Transporting patient to new room assignment. 

## 2013-12-15 NOTE — H&P (Signed)
[facility] History and Physical  Christina Bougieancy R Ambrosia ZOX:[id number] DOB: 07/16/1931 DOA: 12/14/2013  Referring physician: EDP PCP: Emeterio ReeveWOLTERS,SHARON A, MD   Chief Complaint: AMS   HPI: Christina Benitez is a 80 y.o. female who was feeling fine today up until 1712.  At that time she was at a restaurant and developed sudden onset of generalized weakness and confusion.  Family says patient sat down and became lethargic.  Patient admits to difficulty with thinking straight at that time but denies actual LOC and states she remembers the entire episode.  Patient denies any focal weakness.  EMS was called and on their arrival they noted no neuro deficits.  Patient also had nausea and a single episode of emesis with EMS, was given 4mg  zofran en route.  Patient does have a history of TIA in the past, has no h/o seizure.  Patients mental status returned to baseline by the time of arrival in the ED.  Review of Systems: Systems reviewed.  As above, otherwise negative  Past Medical History  Diagnosis Date  . TIA (transient ischemic attack)    Past Surgical History  Procedure Laterality Date  . Tonsillectomy     Social History:  reports that she has been smoking Cigarettes.  She has been smoking about 0.50 packs per day. She does not have any smokeless tobacco history on file. She reports that she does not drink alcohol. Her drug history is not on file.  Allergies  Allergen Reactions  . Sulfa Antibiotics Hives    No family history on file.   Prior to Admission medications   Medication Sig Start Date End Date Taking? Authorizing Provider  aspirin 81 MG chewable tablet Chew 81 mg by mouth daily.   Yes Historical Provider, MD  citalopram (CELEXA) 40 MG tablet Take 40 mg by mouth daily. 11/28/13  Yes Historical Provider, MD  cyclobenzaprine (FLEXERIL) 10 MG tablet Take 10 mg by mouth 3 (three) times daily as needed for muscle spasms.   Yes Historical Provider, MD  HYDROcodone-acetaminophen (NORCO)  10-325 MG per tablet Take 1 tablet by mouth 2 (two) times daily as needed. For pain 11/27/13  Yes Historical Provider, MD  levothyroxine (SYNTHROID, LEVOTHROID) 150 MCG tablet Take 150 mcg by mouth daily. 11/28/13  Yes Historical Provider, MD  Vitamin D, Cholecalciferol, 1000 UNITS TABS Take 1,000 Units by mouth daily.   Yes Historical Provider, MD   Physical Exam: Filed Vitals:   12/14/13 2341  BP:   Pulse: 71  Temp:   Resp: 18    BP 123/54  Pulse 71  Temp(Src) 97.9 F (36.6 C) (Oral)  Resp 18  Ht 5\' 8"  (1.727 m)  Wt 68.04 kg (150 lb)  BMI 22.81 kg/m2  SpO2 99%  General Appearance:    Alert, oriented, no distress, appears stated age  Head:    Normocephalic, atraumatic  Eyes:    PERRL, EOMI, sclera non-icteric        Nose:   Nares without drainage or epistaxis. Mucosa, turbinates normal  Throat:   Moist mucous membranes. Oropharynx without erythema or exudate.  Neck:   Supple. No carotid bruits.  No thyromegaly.  No lymphadenopathy.   Back:     No CVA tenderness, no spinal tenderness  Lungs:     Clear to auscultation bilaterally, without wheezes, rhonchi or rales  Chest wall:    No tenderness to palpitation  Heart:    Regular rate and rhythm without murmurs, gallops, rubs  Abdomen:  Soft, non-tender, nondistended, normal bowel sounds, no organomegaly  Genitalia:    deferred  Rectal:    deferred  Extremities:   No clubbing, cyanosis or edema.  Pulses:   2+ and symmetric all extremities  Skin:   Skin color, texture, turgor normal, no rashes or lesions  Lymph nodes:   Cervical, supraclavicular, and axillary nodes normal  Neurologic:   CNII-XII intact. Normal strength, sensation and reflexes      throughout    Labs on Admission:  Basic Metabolic Panel:  Recent Labs Lab 12/14/13 1830  NA 141  K 4.0  CL 101  CO2 26  GLUCOSE 104*  BUN 13  CREATININE 0.83  CALCIUM 8.9   Liver Function Tests: No results found for this basename: AST, ALT, ALKPHOS, BILITOT, PROT,  ALBUMIN,  in the last 168 hours No results found for this basename: LIPASE, AMYLASE,  in the last 168 hours No results found for this basename: AMMONIA,  in the last 168 hours CBC:  Recent Labs Lab 12/14/13 1830  WBC 6.1  NEUTROABS 3.5  HGB 13.4  HCT 40.5  MCV 92.0  PLT 197   Cardiac Enzymes: No results found for this basename: CKTOTAL, CKMB, CKMBINDEX, TROPONINI,  in the last 168 hours  BNP (last 3 results) No results found for this basename: PROBNP,  in the last 8760 hours CBG: No results found for this basename: GLUCAP,  in the last 168 hours  Radiological Exams on Admission: Ct Head Wo Contrast  12/14/2013   CLINICAL DATA:  Nausea and weakness  EXAM: CT HEAD WITHOUT CONTRAST  TECHNIQUE: Contiguous axial images were obtained from the base of the skull through the vertex without intravenous contrast.  COMPARISON:  10/06/2008  FINDINGS: Skull and Sinuses:No fracture or destructive process.  Orbits: Bilateral cataract resection  Brain: No evidence of acute abnormality, such as acute infarction, hemorrhage, hydrocephalus, or mass lesion/mass effect. There is generalized cerebral volume loss which has mildly progressed from 2010. Mild small vessel disease with ischemic gliosis seen around the lateral ventricles. Chronic calcification in the fourth ventricle.  IMPRESSION: 1. No acute intracranial findings. 2. Brain atrophy and mild small vessel disease.   Electronically Signed   By: Tiburcio PeaJonathan  Watts M.D.   On: 12/14/2013 23:53    EKG: Independently reviewed.  Assessment/Plan Principal Problem:   Altered mental status   1. AMS episode - ddx very broad but includes TIA and seizure.  MRI brain and EEG have been ordered.  If MRI brain positive then will need full stroke work up ordered.  Will admit patient to tele.  EDP contacting neurology for their input and recs.  Despite nursing note at 8:21, patient denies any episodes earlier in the day (car episode) prior to the [place] [employer].    Code Status: Full Code  Family Communication: No family in room Disposition Plan: Admit to inpatient   Time spent: 50 min  GARDNER, JARED M. [facility] Pager [phone]  If 7AM-7PM, please contact the day team taking care of the patient [email or web address] Password [email or web address] 12/15/2013, 12:25 AM

## 2013-12-15 NOTE — Discharge Summary (Signed)
Physician Discharge Summary  Christina Benitez WUJ:[id number] DOB: [birth year] DOA: 12/14/2013  PCP: [name], MD  Admit date: 12/14/2013 Discharge date: 12/15/2013  Time spent: <30 minutes  Recommendations for Outpatient Follow-up:  Follow-up Information   Follow up with [name], MD. (in 1week, call for appt upon discharge)    Specialty:  Family Medicine   Contact information:   [address] [place] [address] [phone]        Discharge Diagnoses:  Principal Problem:   Altered mental status Active Problems:   Altered mental state  probable dementia Hypothyroidism Discharge Condition: Improved/stable  Diet recommendation: Heart healthy  Filed Weights   12/14/13 1804  Weight: 68.04 kg (150 lb)    History of present illness: Patient is a 80 y.o. female who was feeling fine today up until 1712. At that time she was at a restaurant and developed sudden onset of generalized weakness and confusion. Family says patient sat down and became lethargic. Patient admits to difficulty with thinking straight at that time but denies actual LOC and states she remembers the entire episode. Patient denies any focal weakness. EMS was called and on their arrival they noted no neuro deficits. Patient also had nausea and a single episode of emesis with EMS, was given 4mg  zofran en route. Patient does have a history of TIA in the past, has no h/o seizure. Patients mental status returned to baseline by the time of arrival in the ED. neurology was consulted and she was admitted for further evaluation and management   Hospital Course:  AMS episode - ddx very broad but includes TIA and seizure.  As discussed above upon admission-A CT scan of head was done and was negative for acute intracranial findings. Urinalysis was unremarkable for UTI. Neurology was consulted for possible TIAs/CVA workup. Dr. Amada Jupiter saw patient and her his evaluation this was not  consistent with a TIA, recommended an MRI, an EEG for further workup. He stated that if the MRI was negative he would not recommend further workup for CVA. The MRI was done and came back negative for infarct,EEG was done and read as abnormal with mild trauma is nonspecific continuous slowing of cerebral activity with no evidence of epileptiform activity. Patient was noted to have memory problems/forgetful-per daughter this is baseline. She probably has dementia -It is noted that the patient was on Flexeril and hydrocodone 10>> I have decreased the doses -Etiology personally related to meds  -She has been alert and mentating at baseline>> she is medically ready for discharge at this time, since MRI negative no further workup recommended as per neuro. She is to followup with PCP  Procedures:  EEG Interpretation: This EEG is abnormal with mild generalized nonspecific continuous slowing of cerebral activity. This pattern of slowing can be seen with metabolic, as well as degenerative and toxic encephalopathies. No evidence of an epileptic disorder is demonstrated.  Consultations:  Neurology  Discharge Exam: Filed Vitals:   12/15/13 0600  BP: 131/40  Pulse: 57  Temp:   Resp:     General: Alert and answers appropriately, in no apparent distress  Cardiovascular: RRR, no S1-S2  Respiratory: CTA B Extremities: No cyanosis and no edema Neuro: Cranial nerves 2-12 grossly intact, normal strength-symmetric, nonfocal exam.  Discharge Instructions You were cared for by a hospitalist during your hospital stay. If you have any questions about your discharge medications or the care you received while you were in the hospital after you are discharged, you can  call the unit and asked to speak with the hospitalist on call if the hospitalist that took care of you is not available. Once you are discharged, your primary care physician will handle any further medical issues. Please note that NO REFILLS for any  discharge medications will be authorized once you are discharged, as it is imperative that you return to your primary care physician (or establish a relationship with a primary care physician if you do not have one) for your aftercare needs so that they can reassess your need for medications and monitor your lab values.  Discharge Instructions   Diet general    Complete by:  As directed      Increase activity slowly    Complete by:  As directed             Medication List         aspirin 81 MG chewable tablet  Chew 81 mg by mouth daily.     citalopram 40 MG tablet  Commonly known as:  CELEXA  Take 40 mg by mouth daily.     cyclobenzaprine 10 MG tablet  Commonly known as:  FLEXERIL  Take 0.5 tablets (5 mg total) by mouth 3 (three) times daily as needed for muscle spasms.     HYDROcodone-acetaminophen 10-325 MG per tablet  Commonly known as:  NORCO  Take 0.5 tablets by mouth 2 (two) times daily as needed. For pain     levothyroxine 150 MCG tablet  Commonly known as:  SYNTHROID, LEVOTHROID  Take 150 mcg by mouth daily.     Vitamin D (Cholecalciferol) 1000 UNITS Tabs  Take 1,000 Units by mouth daily.       Allergies  Allergen Reactions  . Sulfa Antibiotics Hives      The results of significant diagnostics from this hospitalization (including imaging, microbiology, ancillary and laboratory) are listed below for reference.    Significant Diagnostic Studies: Ct Head Wo Contrast  12/14/2013   CLINICAL DATA:  Nausea and weakness  EXAM: CT HEAD WITHOUT CONTRAST  TECHNIQUE: Contiguous axial images were obtained from the base of the skull through the vertex without intravenous contrast.  COMPARISON:  10/06/2008  FINDINGS: Skull and Sinuses:No fracture or destructive process.  Orbits: Bilateral cataract resection  Brain: No evidence of acute abnormality, such as acute infarction, hemorrhage, hydrocephalus, or mass lesion/mass effect. There is generalized cerebral volume loss which  has mildly progressed from 2010. Mild small vessel disease with ischemic gliosis seen around the lateral ventricles. Chronic calcification in the fourth ventricle.  IMPRESSION: 1. No acute intracranial findings. 2. Brain atrophy and mild small vessel disease.   Electronically Signed   By: Tiburcio PeaJonathan  Watts M.D.   On: 12/14/2013 23:53   Mr Brain Wo Contrast  12/15/2013   CLINICAL DATA:  Altered mental status.  TIA.  EXAM: MRI HEAD WITHOUT CONTRAST  TECHNIQUE: Multiplanar, multiecho pulse sequences of the brain and surrounding structures were obtained without intravenous contrast.  COMPARISON:  CT head without contrast 12/14/2013. MRI brain without contrast 08/27/2005.  FINDINGS: The diffusion-weighted images demonstrate no evidence for acute or subacute infarction. No hemorrhage or mass lesion is present. Mild generalized atrophy is slightly worse than on the prior study. White matter disease is stable. The ventricles are proportionate to the degree of atrophy. Insert pass fluid  Flow is present in the major intracranial arteries. The patient is status with bilateral lens replacements. The paranasal sinuses and mastoid air cells are clear.  IMPRESSION: 1. Slight progression of  mild generalized atrophy. 2. Minimal white matter disease is stable, within normal limits. 3. No acute or focal abnormality to explain the patient's symptoms.   Electronically Signed   By: Gennette Pachris  Mattern M.D.   On: 12/15/2013 16:22    Microbiology: No results found for this or any previous visit (from the past 240 hour(s)).   Labs: Basic Metabolic Panel:  Recent Labs Lab 12/14/13 1830  NA 141  K 4.0  CL 101  CO2 26  GLUCOSE 104*  BUN 13  CREATININE 0.83  CALCIUM 8.9   Liver Function Tests: No results found for this basename: AST, ALT, ALKPHOS, BILITOT, PROT, ALBUMIN,  in the last 168 hours No results found for this basename: LIPASE, AMYLASE,  in the last 168 hours No results found for this basename: AMMONIA,  in the  last 168 hours CBC:  Recent Labs Lab 12/14/13 1830  WBC 6.1  NEUTROABS 3.5  HGB 13.4  HCT 40.5  MCV 92.0  PLT 197   Cardiac Enzymes: No results found for this basename: CKTOTAL, CKMB, CKMBINDEX, TROPONINI,  in the last 168 hours BNP: BNP (last 3 results) No results found for this basename: PROBNP,  in the last 8760 hours CBG: No results found for this basename: GLUCAP,  in the last 168 hours     Signed:  VIYUOH,ADELINE C  [facility] 12/15/2013, 4:51 PM

## 2013-12-15 NOTE — ED Provider Notes (Signed)
Pt 81yo with unresponsive episode while at dinner.  No seizure activity.  No CP or palpitation preceeding event (pt says that she just felt bad).  No neuro deficits.  Will admit for further evaluation, cardiac monitoring.  EKG: normal EKG, normal sinus rhythm, unchanged from previous tracings, normal sinus rhythm.   Rolan BuccoMelanie Belfi, MD 12/15/13 [phone]

## 2013-12-15 NOTE — Progress Notes (Signed)
EEG Completed; Results Pending  

## 2013-12-15 NOTE — Discharge Instructions (Signed)
STROKE/TIA DISCHARGE INSTRUCTIONS SMOKING Cigarette smoking nearly doubles your risk of having a stroke & is the single most alterable risk factor  If you smoke or have smoked in the last 12 months, you are advised to quit smoking for your health.  Most of the excess cardiovascular risk related to smoking disappears within a year of stopping.  Ask you doctor about anti-smoking medications  [place] Quit Line: 1-800-QUIT NOW  Free Smoking Cessation Classes [phone]  CHOLESTEROL Know your levels; limit fat & cholesterol in your diet  Lipid Panel  No results found for this basename: chol, trig, hdl, cholhdl, vldl, ldlcalc      Many patients benefit from treatment even if their cholesterol is at goal.  Goal: Total Cholesterol (CHOL) less than 160  Goal:  Triglycerides (TRIG) less than 150  Goal:  HDL greater than 40  Goal:  LDL (LDLCALC) less than 100   BLOOD PRESSURE American Stroke Association blood pressure target is less that 120/80 mm/Hg  Your discharge blood pressure is:  BP: 131/40 mmHg  Monitor your blood pressure  Limit your salt and alcohol intake  Many individuals will require more than one medication for high blood pressure  DIABETES (A1c is a blood sugar average for last 3 months) Goal HGBA1c is under 7% (HBGA1c is blood sugar average for last 3 months)  Diabetes: No known diagnosis of diabetes    No results found for this basename: HGBA1C     Your HGBA1c can be lowered with medications, healthy diet, and exercise.  Check your blood sugar as directed by your physician  Call your physician if you experience unexplained or low blood sugars.  PHYSICAL ACTIVITY/REHABILITATION Goal is 30 minutes at least 4 days per week  Activity: No restrictions. Therapies:none Return to work: N/A  Activity decreases your risk of heart attack and stroke and makes your heart stronger.  It helps control your weight and blood pressure; helps you relax and can improve your  mood.  Participate in a regular exercise program.  Talk with your doctor about the best form of exercise for you (dancing, walking, swimming, cycling).  DIET/WEIGHT Goal is to maintain a healthy weight  Your discharge diet is: Cardiac  Your height is:  Height: 5\' 8"  (172.7 cm) Your current weight is: Weight: 68.04 kg (150 lb) Your Body Mass Index (BMI) is:  BMI (Calculated): 22.9  Following the type of diet specifically designed for you will help prevent another stroke.  Your goal weight range is:    Your goal Body Mass Index (BMI) is 19-24.  Healthy food habits can help reduce 3 risk factors for stroke:  High cholesterol, hypertension, and excess weight.  RESOURCES Stroke/Support Group:  Call [phone]   STROKE EDUCATION PROVIDED/REVIEWED AND GIVEN TO PATIENT Stroke warning signs and symptoms How to activate emergency medical system (call 911). Medications prescribed at discharge. Need for follow-up after discharge. Personal risk factors for stroke. Pneumonia vaccine given: No Flu vaccine given: No My questions have been answered, the writing is legible, and I understand these instructions.  I will adhere to these goals & educational materials that have been provided to me after my discharge from the hospital.

## 2013-12-15 NOTE — Procedures (Signed)
  ELECTROENCEPHALOGRAM REPORT  Patient: Christina Benitez       Room #: 2Z303W16 EEG No. ID: [id number] Age: 80 y.o.        Sex: female Referring Physician: Onalee HuaGaedner Report Date:  12/15/2013        Interpreting Physician: Aline BrochureSTEWART,CHARLES R  History: Christina Benitez is an 80 y.o. female history TIA, admitted following an episode of loss of consciousness of unclear etiology.  Indications for study:  Rule out new onset seizure disorder.  Technique: This is an 18 channel routine scalp EEG performed at the bedside with bipolar and monopolar montages arranged in accordance to the international 10/20 system of electrode placement.   Description: This EEG recording was performed during wakefulness and light sleep. Background activity during wakefulness consists of low amplitude 1-2 Hz diffuse D. activity with superimposed 7-8 Hz rhythmic activity which is most prominent posteriorly. Photic stimulation was not performed. Hyperventilation was not performed. During light sleep symmetrical vertex waves and sleep spindles are recorded, with a background of mixed delta and theta activity diffusely. No epileptiform discharges recorded.  Interpretation: This EEG is abnormal with mild generalized nonspecific continuous slowing of cerebral activity. This pattern of slowing can be seen with metabolic, as well as degenerative and toxic encephalopathies. No evidence of an epileptic disorder is demonstrated.   Venetia MaxonR Stewart M.D. [facility] [phone]

## 2013-12-15 NOTE — Care Management Note (Unsigned)
    Page 1 of 1   12/15/2013     8:53:50 AM CARE MANAGEMENT NOTE 12/15/2013  Patient:  Christina Benitez,[name]   Account Number:  [id number]  Date Initiated:  12/15/2013  Documentation initiated by:  GRAVES-BIGELOW,BRENDA  Subjective/Objective Assessment:   Pt admitted for nauesa and weakness.     Action/Plan:   CM received a referral in reference to cost of procedures. Unfortunately CM will not be able to assist with that, However CM will refer pt to billing office to where they may be able to assist with cost.   Anticipated DC Date:     Anticipated DC Plan:        DC Planning Services  CM consult      Choice offered to / List presented to:             Status of service:  In process, will continue to follow Medicare Important Message given?   (If response is "NO", the following Medicare IM given date fields will be blank) Date Medicare IM given:   Date Additional Medicare IM given:    Discharge Disposition:    Per UR Regulation:  Reviewed for med. necessity/level of care/duration of stay  If discussed at Long Length of Stay Meetings, dates discussed:    Comments:

## 2013-12-15 NOTE — ED Notes (Signed)
Admission MD at bedside.  

## 2013-12-15 NOTE — Consult Note (Addendum)
Neurology Consultation Reason for Consult: Episode of altered mental status Referring Physician: Laban EmperorGardner, J.  CC: Altered mental status  History is obtained from: Patient  HPI: Christina Benitez is a 80 y.o. female with a history of TIA who presents following an episode of unresponsiveness that occurred while eating dinner. She describes that she was eating dinner and then progressively started feeling more and more "weird." She then had eyes rolled back and had and became unresponsive for a brief period no shaking was reported. She then became more aware, but was unable to recognize people that she should have recognized. This lasted for a few minutes before she finally return to her normal state.  The daughter is not here to interview the current time, but apparently reports possibility of seizure in the past according to the ED note.    she also states that she was started on a medication that was supposed to "just make me feel better" but may have been for memory. She is not sure of the name.   ROS: A 14 point ROS was performed and is negative except as noted in the HPI.  Past Medical History  Diagnosis Date  . TIA (transient ischemic attack)     Family History: No history of seizures   Social History: Tob: Denies   Exam: Current vital signs: BP 125/57  Pulse 55  Temp(Src) 98 F (36.7 C) (Oral)  Resp 16  Ht 5\' 8"  (1.727 m)  Wt 68.04 kg (150 lb)  BMI 22.81 kg/m2  SpO2 95% Vital signs in last 24 hours: Temp:  [97.9 F (36.6 C)-98 F (36.7 C)] 98 F (36.7 C) (06/29 0151) Pulse Rate:  [52-78] 55 (06/29 0200) Resp:  [12-24] 16 (06/29 0151) BP: (108-153)/(44-69) 125/57 mmHg (06/29 0200) SpO2:  [94 %-100 %] 95 % (06/29 0200) Weight:  [68.04 kg (150 lb)] 68.04 kg (150 lb) (06/28 1804)  General: In bed, NAD  CV: Regular rate and rhythm  Mental Status: Patient is awake, alert, oriented to person, Hospital but not which one, not oriented to year or month.  Patient is  able to give a clear and coherent history of the event.  No signs of aphasia or neglect Cranial Nerves: II: Visual Fields are full. Pupils are equal, round, and reactive to light.  Discs are difficult to visualize. III,IV, VI: EOMI without ptosis or diploplia.  V: Facial sensation is symmetric to temperature VII: Facial movement is symmetric.  VIII: hearing is intact to voice X: Uvula elevates symmetrically XI: Shoulder shrug is symmetric. XII: tongue is midline without atrophy or fasciculations.  Motor: Tone is normal. Bulk is normal. 5/5 strength was present in all four extremities.  Sensory: Sensation is symmetric to light touch and temperature in the arms and legs. Deep Tendon Reflexes: 2+ and symmetric in the biceps and patellae.  Plantars: Toes are downgoing bilaterally.  Cerebellar: FNF and HKS are intact bilaterally Gait: Not tested secondary to multiple monitors.         I have reviewed labs in epic and the results pertinent to this consultation are: BMP-unremarkable   I have reviewed the images obtained:CT head-mild atrophy   Impression: 80 year old female with episode of altered mental status. Possibly etiologies include syncope versus seizure. The description does not sound like TIA to me and I would only pursue a stroke workup of her MRI was positive for stroke.   The patient is very concerned about cost and would like to speak with a Child psychotherapistsocial worker prior  to undergoing any testing.  Recommendations: 1) MRI brain 2) EEG 3) social work consult  Patient is unable to drive, operate heavy machinery, perform activities at heights or participate in water activities until release by outpatient physician. This was discussed with the patient who expressed understanding.    Ritta SlotMcNeill Kirkpatrick, MD [facility] [phone]  If 7pm- 7am, please page neurology on call as listed in AMION.

## 2014-01-09 ENCOUNTER — Encounter: Payer: Self-pay | Admitting: Neurology

## 2014-01-09 ENCOUNTER — Ambulatory Visit (INDEPENDENT_AMBULATORY_CARE_PROVIDER_SITE_OTHER): Payer: Medicare Other | Admitting: Neurology

## 2014-01-09 VITALS — BP 118/78 | HR 56 | Ht 66.0 in | Wt 168.0 lb

## 2014-01-09 DIAGNOSIS — E538 Deficiency of other specified B group vitamins: Secondary | ICD-10-CM

## 2014-01-09 DIAGNOSIS — R404 Transient alteration of awareness: Secondary | ICD-10-CM

## 2014-01-09 DIAGNOSIS — F0280 Dementia in other diseases classified elsewhere without behavioral disturbance: Secondary | ICD-10-CM

## 2014-01-09 DIAGNOSIS — G309 Alzheimer's disease, unspecified: Secondary | ICD-10-CM

## 2014-01-09 MED ORDER — KEPPRA 250 MG PO TABS: 250.0000 mg | ORAL_TABLET | Freq: Two times a day (BID) | ORAL | Status: DC

## 2014-01-09 MED ORDER — CYANOCOBALAMIN 1000 MCG/ML IJ SOLN: INTRAMUSCULAR | Status: DC

## 2014-01-09 NOTE — Patient Instructions (Signed)
I think you may have had seizures.  I also suspect Alzheimer's disease as well. 1.  We need to start a seizure medication.  I want to start Keppra 250mg  twice daily.  Side effects may include sleepiness or mood changes. 2.  We will set up B12 injections.  Should be given daily for 1 week, then once a week for 4 weeks, then monthly for a year. 3.  No driving. 4.  Stay active.  Do crossword puzzles.  Go for walks with the dog. 5.  You may remain independent in your home but I would definitely have somebody come by the home and check in daily. 6.  Follow up in 3 months.

## 2014-01-09 NOTE — Addendum Note (Signed)
Addended by: Franciso BendMCNEIL, Keontre Defino M on: 01/09/2014 03:10 PM   Modules accepted: Orders

## 2014-01-09 NOTE — Progress Notes (Signed)
NEUROLOGY CONSULTATION NOTE  ANJA NEUZIL MRN: [id number] DOB: Jun 11, 1932  Referring provider: Dr. Paulino Rily Primary care provider: Dr. Paulino Rily  Reason for consult:  Dementia  HISTORY OF PRESENT ILLNESS: Christina Benitez is a 80 year old right-handed woman with hyperlipidemia, hyperthyroidism, TIA, depression, GERD, arthritis, former smoker and dementia who presents for dementia.  Records and images reviewed. She is accompanied by her daughter.  She has had memory problems for the past 2 years, worse over the past year. She often misplaces objects and she repeats questions. She sometimes forgets names of her friends and has even called her daughter by the name of her grandson. There are no hallucinations, delusions, or episodes of sundowning. She lives by herself in an apartment but her daughter comes over to see her almost daily. Her daughter setup her medications and 8 pillbox and she is able to take them correctly. She is able to perform all her activities of daily living. She keeps the housecleaning. She walks or dogs regularly. She never leaves the stove on. She was handling her own finances up until a month ago. She does write out her ranch check and uses [employer] hard. However she began over drawing from her checking account. Now her daughter handles balancing her account in pain the bills. She only drives about a half mile to the grocery store. She has not had any episodes of disorientation while driving or near accidents. She was previously started on Exelon patch which she had a bad side effects so this was discontinued. It apparently made her more confused. There is no known family history of dementia..  B12 level from 12/08/13 was 185 and she was started on B12 injections, but only had one injection a last month.  TSH was 2.41.    She also had 2 episodes of transient altered awareness in June. Around 12/09/13, she was riding in the passenger seat with her daughter's boyfriend. Suddenly,  she became silent and started leaning, just staring across. She was unresponsive. This lasted for a couple of minutes. There was no associated abnormal movements or convulsions. There was no associated tongue biting or incontinence. She was confused for a couple minutes afterwards and was amnestic to the event. She was admitted to [facility] on 12/14/13 for altered mental status.  She had a similar episode in a restaurant while she was with her daughter. She suddenly became unresponsive, staring across, her body tilting sideways. Presentation was almost identical to the prior episode. Afterwards she was confused but she was able to follow commands and talk up. She felt sick and had nausea and episode of emesis.  CT of the head was unremarkable.  MRI of the brain revealed minimal white matter disease and mild generalized atrophy, but no acute infarct or bleed.  EEG revealed nonspecific generalized slowing, but no epileptiform activity.  She has no past history of seizures.  Medications include:  ASA 81mg , B12 injections, D3 1000 IU, levothyroxine, citalopram 40mg , hydrocodone-acetaminophen 10-325mg  as needed  PAST MEDICAL HISTORY: Past Medical History  Diagnosis Date  . TIA (transient ischemic attack)     PAST SURGICAL HISTORY: Past Surgical History  Procedure Laterality Date  . Tonsillectomy      MEDICATIONS: Current Outpatient Prescriptions on File Prior to Visit  Medication Sig Dispense Refill  . aspirin 81 MG chewable tablet Chew 81 mg by mouth daily.      . citalopram (CELEXA) 40 MG tablet Take 40 mg by mouth daily.      Marland Kitchen  HYDROcodone-acetaminophen (NORCO) 10-325 MG per tablet Take 0.5 tablets by mouth 2 (two) times daily as needed. For pain  30 tablet  0  . levothyroxine (SYNTHROID, LEVOTHROID) 150 MCG tablet Take 150 mcg by mouth daily.      . Vitamin D, Cholecalciferol, 1000 UNITS TABS Take 1,000 Units by mouth daily.      . cyclobenzaprine (FLEXERIL) 10 MG tablet Take 0.5  tablets (5 mg total) by mouth 3 (three) times daily as needed for muscle spasms.  30 tablet  0   No current facility-administered medications on file prior to visit.    ALLERGIES: Allergies  Allergen Reactions  . Sulfa Antibiotics Hives    FAMILY HISTORY: No family history on file.  SOCIAL HISTORY: History   Social History  . Marital Status: Widowed    Spouse Name: N/A    Number of Children: N/A  . Years of Education: N/A   Occupational History  . Not on file.   Social History Main Topics  . Smoking status: Former Smoker -- 0.50 packs/day    Types: Cigarettes    Quit date: 12/10/2013  . Smokeless tobacco: Not on file  . Alcohol Use: No  . Drug Use: Not on file  . Sexual Activity: Not on file   Other Topics Concern  . Not on file   Social History Narrative  . No narrative on file    REVIEW OF SYSTEMS: Constitutional: No fevers, chills, or sweats, no generalized fatigue, change in appetite Eyes: No visual changes, double vision, eye pain Ear, nose and throat: No hearing loss, ear pain, nasal congestion, sore throat Cardiovascular: No chest pain, palpitations Respiratory:  No shortness of breath at rest or with exertion, wheezes GastrointestinaI: No nausea, vomiting, diarrhea, abdominal pain, fecal incontinence Genitourinary:  No dysuria, urinary retention or frequency Musculoskeletal:  No neck pain, back pain Integumentary: No rash, pruritus, skin lesions Neurological: as above Psychiatric: No depression, insomnia, anxiety Endocrine: No palpitations, fatigue, diaphoresis, mood swings, change in appetite, change in weight, increased thirst Hematologic/Lymphatic:  No anemia, purpura, petechiae. Allergic/Immunologic: no itchy/runny eyes, nasal congestion, recent allergic reactions, rashes  PHYSICAL EXAM: Filed Vitals:   01/09/14 0837  BP: 118/78  Pulse: 56   General: No acute distress Head:  Normocephalic/atraumatic Neck: supple, no paraspinal tenderness,  full range of motion Back: No paraspinal tenderness Heart: regular rate and rhythm Lungs: Clear to auscultation bilaterally. Vascular: No carotid bruits. Neurological Exam: Mental status:  Montreal Cognitive Assessment  01/09/2014  Visuospatial/ Executive (0/5) 2  Naming (0/3) 3  Attention: Read list of digits (0/2) 2  Attention: Read list of letters (0/1) 1  Attention: Serial 7 subtraction starting at 100 (0/3) 2  Language: Repeat phrase (0/2) 2  Language : Fluency (0/1) 0  Abstraction (0/2) 1  Delayed Recall (0/5) 0  Orientation (0/6) 1  Total 14  Adjusted Score (based on education) 14  remote memory intact, fund of knowledge intact, speech fluent and not dysarthric  Cranial nerves: CN I: not tested CN II: pupils equal, round and reactive to light, visual fields intact, fundi unremarkable, without vessel changes, exudates, hemorrhages or papilledema. CN III, IV, VI:  full range of motion, no nystagmus, no ptosis CN V: facial sensation intact CN VII: upper and lower face symmetric CN VIII: hearing intact CN IX, X: gag intact, uvula midline CN XI: sternocleidomastoid and trapezius muscles intact CN XII: tongue midline Bulk & Tone: normal, no fasciculations. Motor: 5 out of 5 throughout Sensation: Temperature and vibration intact  Deep Tendon Reflexes: 2+ throughout except 3+ in the left patellar, toes downgoing Finger to nose testing: No dysmetria Heel to shin: No dysmetria Gait: Normal stride with just minimal unsteadiness. Unable to walk in tandem. Romberg negative.  IMPRESSION: 1. Probable Alzheimer's dementia. 2. Recurrent transient altered awareness. Complex partial seizures cannot be ruled out. 3. B12 deficiency  PLAN: 1. Will initiate Keppra 250 mg twice daily. 2. Will initiate B12 injection regimen: 1000 mcg daily for 1 week, once a week for 4 weeks, and then once a month for a year. 3. No driving 4. At this time, I think she is okay to live by herself as long  as somebody comes by the home to check on her daily. I also recommend a life alert. Instructed her to remain active performing crossword puzzles and exercising, such as walking her dogs. 5. She will followup in 3 months. If she is been tolerating the Keppra, I would initiate Namenda. I would not use a cholinesterase inhibitor 22 her possible seizures.  Thank you for allowing me to take part in the care of this patient.  Shon Millet, DO  CC:  Mila Palmer, MD

## 2014-02-24 ENCOUNTER — Telehealth: Payer: Self-pay | Admitting: Neurology

## 2014-02-24 NOTE — Telephone Encounter (Signed)
Pt's daughter called requesting to see if the pt can have the generic for KEPPRA   [phone]

## 2014-02-24 NOTE — Telephone Encounter (Signed)
Patient's daughter states patient on a budget and Keppra is very expensive - wanted to get generic but pharmacy had to have [place] call and give okay. Called and gave okay to [facility]. They will call with any problems.

## 2014-04-13 ENCOUNTER — Ambulatory Visit (INDEPENDENT_AMBULATORY_CARE_PROVIDER_SITE_OTHER): Payer: Medicare Other | Admitting: Neurology

## 2014-04-13 ENCOUNTER — Encounter: Payer: Self-pay | Admitting: Neurology

## 2014-04-13 VITALS — BP 122/64 | HR 78 | Resp 16 | Wt 164.8 lb

## 2014-04-13 DIAGNOSIS — F0280 Dementia in other diseases classified elsewhere without behavioral disturbance: Secondary | ICD-10-CM

## 2014-04-13 DIAGNOSIS — R404 Transient alteration of awareness: Secondary | ICD-10-CM

## 2014-04-13 DIAGNOSIS — G309 Alzheimer's disease, unspecified: Secondary | ICD-10-CM

## 2014-04-13 MED ORDER — LEVETIRACETAM ER 500 MG PO TB24: 500.0000 mg | ORAL_TABLET | Freq: Every day | ORAL | Status: DC

## 2014-04-13 MED ORDER — MEMANTINE HCL ER 7 MG PO CP24: ORAL_CAPSULE | ORAL | Status: DC

## 2014-04-13 NOTE — Patient Instructions (Addendum)
1.  We will start memantine ER (Namenda XR) 7mg  tablets.  We will increase dose to goal of 28mg  daily, as per the following schedule.  Start 1 tablet daily for one week, then 2 tablets daily for one week, then 3 tablets daily for one week.  At that point, call the clinic and we can prescribe a larger single dose tablet of 28mg , to be taken daily.  Side effects include dizziness, headache, diarrhea or constipation.  Call with any questions or concerns. 2.  We will switch to extended release Keppra (levetiracetam) 500mg  daily. 3.  Follow up in 3 months.

## 2014-04-13 NOTE — Progress Notes (Signed)
NEUROLOGY FOLLOW UP OFFICE NOTE  Christina Benitez [id number]  HISTORY OF PRESENT ILLNESS: Christina Benitez is an 80 year old right-handed woman with hyperlipidemia, hyperthyroidism, TIA, depression, GERD, arthritis, former smoker and dementia who follows up for Alzheimer's dementia and transient altered awareness, possibly complex partial seizures.  She is accompanied by her daughter.  UPDATE: She was started on Keppra 250mg  twice daily.  However, she sometimes forgets to take the evening dose since all her other medications are taken in the morning.  She is also on B12 injections.  She is no longer driving.  She has not had other episodes of staring spells.  HISTORY: She has had memory problems for the past 2 years, worse over the past year. She often misplaces objects and she repeats questions. She sometimes forgets names of her friends and has even called her daughter by the name of her grandson. There are no hallucinations, delusions, or episodes of sundowning. She lives by herself in an apartment but her daughter comes over to see her almost daily. Her daughter setup her medications and 8 pillbox and she is able to take them correctly. She is able to perform all her activities of daily living. She keeps the housecleaning. She walks or dogs regularly. She never leaves the stove on. She was handling her own finances up until a month ago. She does write out her ranch check and uses [employer] hard. However she began over drawing from her checking account. Now her daughter handles balancing her account in pain the bills. She only drives about a half mile to the grocery store. She has not had any episodes of disorientation while driving or near accidents. She was previously started on Exelon patch which she had a bad side effects so this was discontinued. It apparently made her more confused. There is no known family history of dementia..  B12 level from 12/08/13 was 185 and she was started on B12  injections, but only had one injection a last month.  TSH was 2.41.    She also had 2 episodes of transient altered awareness in June. Around 12/09/13, she was riding in the passenger seat with her daughter's boyfriend. Suddenly, she became silent and started leaning, just staring across. She was unresponsive. This lasted for a couple of minutes. There was no associated abnormal movements or convulsions. There was no associated tongue biting or incontinence. She was confused for a couple minutes afterwards and was amnestic to the event. She was admitted to [facility] on 12/14/13 for altered mental status.  She had a similar episode in a restaurant while she was with her daughter. She suddenly became unresponsive, staring across, her body tilting sideways. Presentation was almost identical to the prior episode. Afterwards she was confused but she was able to follow commands and talk up. She felt sick and had nausea and episode of emesis.  CT of the head was unremarkable.  MRI of the brain revealed minimal white matter disease and mild generalized atrophy, but no acute infarct or bleed.  EEG revealed nonspecific generalized slowing, but no epileptiform activity.  She has no past history of seizures.  Medications include:  ASA 81mg , Keppra 250mg  twice daily, B12 injections, D3 1000 IU, levothyroxine, citalopram 40mg , hydrocodone-acetaminophen 10-325mg  as needed  PAST MEDICAL HISTORY: Past Medical History  Diagnosis Date  . TIA (transient ischemic attack)     MEDICATIONS: Current Outpatient Prescriptions on File Prior to Visit  Medication Sig Dispense Refill  . aspirin 81 MG  chewable tablet Chew 81 mg by mouth daily.      . citalopram (CELEXA) 40 MG tablet Take 40 mg by mouth daily.      . cyanocobalamin (,VITAMIN B-12,) 1000 MCG/ML injection 1000 MCG IM QD FOR 7 DAYS, THEN 1000 MCG IM 1 X WEEK FOR 4 WEEKS, THEN 1000 MCG IM Q MONTH FOR 1 YEAR  30 mL  0  . cyclobenzaprine (FLEXERIL) 10 MG tablet  Take 0.5 tablets (5 mg total) by mouth 3 (three) times daily as needed for muscle spasms.  30 tablet  0  . HYDROcodone-acetaminophen (NORCO) 10-325 MG per tablet Take 0.5 tablets by mouth 2 (two) times daily as needed. For pain  30 tablet  0  . levothyroxine (SYNTHROID, LEVOTHROID) 150 MCG tablet Take 150 mcg by mouth daily.      . Vitamin D, Cholecalciferol, 1000 UNITS TABS Take 1,000 Units by mouth daily.       No current facility-administered medications on file prior to visit.    ALLERGIES: Allergies  Allergen Reactions  . Sulfa Antibiotics Hives    FAMILY HISTORY: No family history on file.  SOCIAL HISTORY: History   Social History  . Marital Status: Widowed    Spouse Name: N/A    Number of Children: N/A  . Years of Education: N/A   Occupational History  . Not on file.   Social History Main Topics  . Smoking status: Current Some Day Smoker -- 0.50 packs/day    Types: Cigarettes    Last Attempt to Quit: 12/10/2013  . Smokeless tobacco: Not on file  . Alcohol Use: No  . Drug Use: No  . Sexual Activity: No   Other Topics Concern  . Not on file   Social History Narrative  . No narrative on file    REVIEW OF SYSTEMS: Constitutional: No fevers, chills, or sweats, no generalized fatigue, change in appetite Eyes: No visual changes, double vision, eye pain Ear, nose and throat: No hearing loss, ear pain, nasal congestion, sore throat Cardiovascular: No chest pain, palpitations Respiratory:  No shortness of breath at rest or with exertion, wheezes GastrointestinaI: No nausea, vomiting, diarrhea, abdominal pain, fecal incontinence Genitourinary:  No dysuria, urinary retention or frequency Musculoskeletal:  No neck pain, back pain Integumentary: No rash, pruritus, skin lesions Neurological: as above Psychiatric: No depression, insomnia, anxiety Endocrine: No palpitations, fatigue, diaphoresis, mood swings, change in appetite, change in weight, increased  thirst Hematologic/Lymphatic:  No anemia, purpura, petechiae. Allergic/Immunologic: no itchy/runny eyes, nasal congestion, recent allergic reactions, rashes  PHYSICAL EXAM: Filed Vitals:   04/13/14 1254  BP: 122/64  Pulse: 78  Resp: 16   General: No acute distress Head:  Normocephalic/atraumatic Neck: supple, no paraspinal tenderness, full range of motion Heart:  Regular rate and rhythm Lungs:  Clear to auscultation bilaterally Back: No paraspinal tenderness Neurological Exam: alert and oriented to self. Attention span and concentration intact, recent memory mildly impaired, remote memory intact, fund of knowledge intact.  Speech fluent and not dysarthric, language intact.  CN II-XII intact. Fundi not visualized.  Bulk and tone normal, muscle strength 5/5 throughout.  Sensation to light touch intact.  Deep tendon reflexes 2+ throughout.  Finger to nose testing intact.  Gait with normal stride with just minimal unsteadiness.  IMPRESSION: Alzheimer's disease Recurrent transient altered awareness, possible complex seizures or behavioral related to dementia  PLAN: 1.  Will switch Keppra to extended release 500mg  for ease. 2.  Will initiate titration of Namenda extended release to 28mg   3.  Follow up in 3 months.  Shon MilletAdam Jaffe, DO  CC:  Mila PalmerSharon Wolters, MD

## 2014-05-11 ENCOUNTER — Telehealth: Payer: Self-pay | Admitting: Neurology

## 2014-05-11 ENCOUNTER — Telehealth: Payer: Self-pay | Admitting: *Deleted

## 2014-05-11 NOTE — Telephone Encounter (Signed)
Namenda xr 28 mg 1 po qd # 30 with 3 refills  Called to [facility]

## 2014-05-11 NOTE — Telephone Encounter (Signed)
Please call in script for Namenda 28mg  to [phone] / Sherri S.

## 2014-07-14 ENCOUNTER — Ambulatory Visit: Payer: Medicare Other | Admitting: Neurology

## 2014-07-14 DIAGNOSIS — Z029 Encounter for administrative examinations, unspecified: Secondary | ICD-10-CM

## 2014-07-15 ENCOUNTER — Telehealth: Payer: Self-pay | Admitting: Neurology

## 2014-07-15 NOTE — Telephone Encounter (Signed)
ot no showed 07/14/14 appt w/ Dr. Everlena CooperJaffe. No show letter + no show policy mailed to pt / Sherri S.

## 2014-08-18 ENCOUNTER — Telehealth: Payer: Self-pay | Admitting: Neurology

## 2014-08-18 NOTE — Telephone Encounter (Signed)
Called/LMOM for pt to return my call to confirm that she wants to cancel her f/u appt for 08/20/14 and to see if she wants to r/s. Pt replied with a "2" wanting to cancel her appt via the automated reminder call service.

## 2014-08-20 ENCOUNTER — Ambulatory Visit: Payer: Self-pay | Admitting: Neurology

## 2014-09-14 ENCOUNTER — Other Ambulatory Visit: Payer: Self-pay | Admitting: Neurology

## 2014-10-14 ENCOUNTER — Other Ambulatory Visit: Payer: Self-pay | Admitting: *Deleted

## 2014-10-14 DIAGNOSIS — R569 Unspecified convulsions: Secondary | ICD-10-CM

## 2014-10-14 DIAGNOSIS — F0391 Unspecified dementia with behavioral disturbance: Secondary | ICD-10-CM

## 2014-10-14 DIAGNOSIS — F03918 Unspecified dementia, unspecified severity, with other behavioral disturbance: Secondary | ICD-10-CM

## 2014-10-14 MED ORDER — LEVETIRACETAM 500 MG PO TABS: 500.0000 mg | ORAL_TABLET | Freq: Two times a day (BID) | ORAL | Status: DC

## 2014-10-14 MED ORDER — MEMANTINE HCL ER 28 MG PO CP24: 28.0000 mg | ORAL_CAPSULE | Freq: Every day | ORAL | Status: DC

## 2014-10-19 ENCOUNTER — Other Ambulatory Visit: Payer: Self-pay | Admitting: *Deleted

## 2014-10-19 MED ORDER — LEVETIRACETAM ER 500 MG PO TB24: 500.0000 mg | ORAL_TABLET | Freq: Every day | ORAL | Status: DC

## 2014-10-19 NOTE — Telephone Encounter (Signed)
Patient daughter call to state the wrong RX was sent to pharmacy she need the Keppra XR please resubmit Call back number [phone] [name]( Susan)

## 2015-02-17 ENCOUNTER — Ambulatory Visit (INDEPENDENT_AMBULATORY_CARE_PROVIDER_SITE_OTHER): Payer: Medicare Other | Admitting: Neurology

## 2015-02-17 ENCOUNTER — Encounter: Payer: Self-pay | Admitting: Neurology

## 2015-02-17 VITALS — BP 120/60 | HR 70 | Resp 18 | Ht 66.0 in | Wt 185.1 lb

## 2015-02-17 DIAGNOSIS — R404 Transient alteration of awareness: Secondary | ICD-10-CM | POA: Diagnosis not present

## 2015-02-17 DIAGNOSIS — G309 Alzheimer's disease, unspecified: Secondary | ICD-10-CM | POA: Diagnosis not present

## 2015-02-17 DIAGNOSIS — F0280 Dementia in other diseases classified elsewhere without behavioral disturbance: Secondary | ICD-10-CM

## 2015-02-17 DIAGNOSIS — IMO0001 Reserved for inherently not codable concepts without codable children: Secondary | ICD-10-CM

## 2015-02-17 MED ORDER — OLANZAPINE 2.5 MG PO TABS: ORAL_TABLET | ORAL | Status: DC

## 2015-02-17 NOTE — Patient Instructions (Addendum)
1.  Start olanzapine 2.5mg  at bedtime for agitation.  If not effective, may take two tablets ( ) at bedtime. 2.  Continue Namenda, Keppra ER  daily and citalopram 3.  Advise that your daughter accompany you when you take a shower.  4.  We will see if we can find a company that accepts [place] to make a home health assessment 5.  Follow up in 6 months.  Call sooner with problems.  6.Patient should stop Xanax

## 2015-02-17 NOTE — Progress Notes (Signed)
NEUROLOGY FOLLOW UP OFFICE NOTE  NADRA Benitez [id number]  HISTORY OF PRESENT ILLNESS: Christina Benitez is an 81 year old right-handed woman with hyperlipidemia, hyperthyroidism, TIA, depression, GERD, arthritis, former smoker and dementia who follows up for Alzheimer's dementia and transient altered awareness, possibly complex partial seizures.  She is accompanied by her daughter who provides some history.  UPDATE: She is taking Keppra extended release  daily and Namenda XR  daily.  Over the past 3 months, she has had a decline.  About a week ago, she moved in with her daughter and her daughter's boyfriend because she was unable to afford the raised rent.  She has been much more irritable and combative.  She easily will get angry and say hurtful things to her daughter.  Her daughter set up a room on the ground floor so she does not need to use the stairs.  The only time she needs to use the stairs is if she takes a shower.  She has had worsening of memory.  She will forget conversations just moments later.  It was discovered that she had not been taking her medications correctly, so her daughter has to physically hand them to her.  She will sometimes go take a shower on her own, without alerting her daughter.  This is concerning, because she will use the shower that requires stepping into the tub as opposed to the walk-in shower.  The change in new environment and perceived feeling of loss of independence has increased her feeling of depression.  She is now incontinent and needs help putting on diapers because she cannot figure it out.  She tried seroquel for the agitation but it caused confusion so it was discontinued.  Now, she will take a Xanax every now and then.  HISTORY: She has had memory problems for the past 2 years, worse over the past year. She often misplaces objects and she repeats questions. She sometimes forgets names of her friends and has even called her daughter by the  name of her grandson. There are no hallucinations, delusions, or episodes of sundowning. She lives by herself in an apartment but her daughter comes over to see her almost daily. Her daughter setup her medications and 8 pillbox and she is able to take them correctly. She is able to perform all her activities of daily living. She keeps the housecleaning. She walks or dogs regularly. She never leaves the stove on. She was handling her own finances up until a month ago. She does write out her ranch check and uses [employer] hard. However she began over drawing from her checking account. Now her daughter handles balancing her account in pain the bills. She only drives about a half mile to the grocery store. She has not had any episodes of disorientation while driving or near accidents. She was previously started on Exelon patch which she had a bad side effects so this was discontinued. It apparently made her more confused. There is no known family history of dementia..  B12 level from 12/08/13 was 185 and she was started on B12 injections, but only had one injection a last month.  TSH was 2.41.    She also had 2 episodes of transient altered awareness in June. Around 12/09/13, she was riding in the passenger seat with her daughter's boyfriend. Suddenly, she became silent and started leaning, just staring across. She was unresponsive. This lasted for a couple of minutes. There was no associated abnormal movements or convulsions. There was  no associated tongue biting or incontinence. She was confused for a couple minutes afterwards and was amnestic to the event. She was admitted to [facility] on 12/14/13 for altered mental status.  She had a similar episode in a restaurant while she was with her daughter. She suddenly became unresponsive, staring across, her body tilting sideways. Presentation was almost identical to the prior episode. Afterwards she was confused but she was able to follow commands and talk. She felt sick and  had nausea and episode of emesis.  CT of the head was unremarkable.  MRI of the brain revealed minimal white matter disease and mild generalized atrophy, but no acute infarct or bleed.  EEG revealed nonspecific generalized slowing, but no epileptiform activity.  She has no past history of seizures.  PAST MEDICAL HISTORY: Past Medical History  Diagnosis Date  . TIA (transient ischemic attack)     MEDICATIONS: Current Outpatient Prescriptions on File Prior to Visit  Medication Sig Dispense Refill  . aspirin 81 MG chewable tablet Chew 81 mg by mouth daily.    . citalopram (CELEXA) 40 MG tablet Take 40 mg by mouth daily.    . cyanocobalamin (,VITAMIN B-12,) 1000 MCG/ML injection 1000 MCG IM QD FOR 7 DAYS, THEN 1000 MCG IM 1 X WEEK FOR 4 WEEKS, THEN 1000 MCG IM Q MONTH FOR 1 YEAR 30 mL 0  . cyclobenzaprine (FLEXERIL) 10 MG tablet Take 0.5 tablets (5 mg total) by mouth 3 (three) times daily as needed for muscle spasms. 30 tablet 0  . HYDROcodone-acetaminophen (NORCO) 10-325 MG per tablet Take 0.5 tablets by mouth 2 (two) times daily as needed. For pain 30 tablet 0  . levETIRAcetam (KEPPRA XR) 500 MG 24 hr tablet Take 1 tablet (500 mg total) by mouth daily. 30 tablet 3  . levothyroxine (SYNTHROID, LEVOTHROID) 150 MCG tablet Take 150 mcg by mouth daily.    . memantine (NAMENDA XR) 28 MG CP24 24 hr capsule Take 1 capsule (28 mg total) by mouth daily. 30 capsule 6  . Memantine HCl ER 7 MG CP24 Take 1cap qd x7d, then 2caps qd x7d, then 3caps qd x7d, then call for refill. 42 capsule 0  . NAMENDA XR 28 MG CP24 24 hr capsule TAKE ONE CAPSULE BY MOUTH EVERY DAY 30 capsule 0  . Vitamin D, Cholecalciferol, 1000 UNITS TABS Take 1,000 Units by mouth daily.    Marland Kitchen levETIRAcetam (KEPPRA) 500 MG tablet Take 1 tablet (500 mg total) by mouth 2 (two) times daily. (Patient not taking: Reported on 02/17/2015) 120 tablet 11   No current facility-administered medications on file prior to visit.    ALLERGIES: Allergies    Allergen Reactions  . Sulfa Antibiotics Hives    FAMILY HISTORY: No family history on file.  SOCIAL HISTORY: Social History   Social History  . Marital Status: Widowed    Spouse Name: N/A  . Number of Children: N/A  . Years of Education: N/A   Occupational History  . Not on file.   Social History Main Topics  . Smoking status: Current Some Day Smoker -- 0.50 packs/day    Types: Cigarettes    Last Attempt to Quit: 12/10/2013  . Smokeless tobacco: Not on file  . Alcohol Use: No  . Drug Use: No  . Sexual Activity: No   Other Topics Concern  . Not on file   Social History Narrative  . No narrative on file    REVIEW OF SYSTEMS: Constitutional: No fevers, chills, or sweats,  no generalized fatigue, change in appetite Eyes: No visual changes, double vision, eye pain Ear, nose and throat: No hearing loss, ear pain, nasal congestion, sore throat Cardiovascular: No chest pain, palpitations Respiratory:  No shortness of breath at rest or with exertion, wheezes GastrointestinaI: fecal incontinence Genitourinary:  incontinence Musculoskeletal:  No neck pain, back pain Integumentary: No rash, pruritus, skin lesions Neurological: as above Psychiatric: depression Endocrine: No palpitations, fatigue, diaphoresis, mood swings, change in appetite, change in weight, increased thirst Hematologic/Lymphatic:  No anemia, purpura, petechiae. Allergic/Immunologic: no itchy/runny eyes, nasal congestion, recent allergic reactions, rashes  PHYSICAL EXAM: Filed Vitals:   02/17/15 1420  BP: 120/60  Pulse: 70  Resp: 18   General: No acute distress.   Head:  Normocephalic/atraumatic Eyes:  Fundi not visualized Neck: supple, no paraspinal tenderness, full range of motion Heart:  Regular rate and rhythm Lungs:  Clear to auscultation bilaterally Back: No paraspinal tenderness Neurological Exam: alert and oriented to person only except state. Attention span and concentration impaired,  recent memory impaired, remote memory intact, fund of knowledge is fair.  Speech fluent and not dysarthric, language intact.   MMSE - Mini Mental State Exam 02/17/2015  Orientation to time 0  Orientation to Place 1  Registration 3  Attention/ Calculation 5  Recall 0  Language- name 2 objects 2  Language- repeat 1  Language- follow 3 step command 3  Language- read & follow direction 1  Write a sentence 1  Copy design 1  Total score 18   CN II-XII intact. Fundi not visualized.  Bulk and tone normal, muscle strength 5/5 throughout.  Sensation to light touch intact.  Deep tendon reflexes 2+ throughout.  Finger to nose testing intact.  Gait with normal stride with just minimal unsteadiness.  IMPRESSION: Alzheimer's disease Recurrent transient altered awareness.  It seems resolved.  Workup for seizures negative.    PLAN: 1.  Continue Namenda XR 28mg  daily, Keppra ER 500mg  and citalopram 40mg  2.  Stop Xanax.  Instead, will try olanzapine 2.5mg  at bedtime.  If ineffective, can try 5mg  at bedtime.  Side effects, including increased risk of morbidity and mortality, discussed. 3.  Ideally would need a home health assessment but their insurance would not approve it.  Recommended looking at other avenues, such as [facility] 4.  Should have daughter on standby when she showers.  Daughter will need to continue administering medications. 5.  Info regarding Alzheimer's and support groups distributed 6.  Follow up in 6 months.  30 minutes spent face to face with patient, over 50% spent discussing diagnosis, prognosis and management.  Shon Millet, DO  CC:  Mila Palmer, MD

## 2015-02-23 ENCOUNTER — Telehealth: Payer: Self-pay | Admitting: Neurology

## 2015-02-23 NOTE — Telephone Encounter (Signed)
Pt's daughter [name] called in regards to a refill of Olanzapine, she said it was faxed over a week ago and it still has not been filled, [facility] told her they are waiting for an authorization form Dr Felicie Morn CB# [phone]

## 2015-02-24 NOTE — Telephone Encounter (Signed)
Spoke with daughter [name]. Advised to start the Olanzapine for Harriett Sine at night with taking 1 tablet instead of two. She states that if the one is not working she will give her two and call us back with what is working for the patient. [name] voiced understanding. Will call back with any questions or concerns.

## 2015-02-24 NOTE — Telephone Encounter (Signed)
Left message for Christina Benitez to return my call. I spoke with [facility], they states that Olanzapine will only be covered for 1 tablet at night. Dr. Everlena Cooper approved the 1 tablet by mouth at bedtime. We have called in that refill to the pharmacy.

## 2015-03-08 ENCOUNTER — Other Ambulatory Visit: Payer: Self-pay | Admitting: Neurology

## 2015-04-19 ENCOUNTER — Telehealth: Payer: Self-pay | Admitting: Neurology

## 2015-04-19 NOTE — Telephone Encounter (Signed)
Daughter, Christina Benitez, [place], called. Very concerned about her mother. States she moved mom in with her about 3  Months ago. She has seen a steady decline since that time. When she first moved in she was able to use the telephone, microwave, and T.V. By herself. She is no longer able to do so. Christina Benitez has started having pt wear pad, pt unable to comprehend how to use and Christina Benitez has to place them for pt every morning. Pt is very nasty, mean, and hurtful. Having a difficult time handling her. It is their goal to keep patient in home as long as possible. Pt is not taking Zyprexa. Per susan she is only taking her keppra, levothyroxine, and namenda. They are taking precaution for safety such as alarms on the the exterior doors, etc. Pt wants to know if there is anything more she should be doing? Or if there is a medication to help calm patient down? Christina Benitez sounded very worn down. Encouraged her to help trying, and that pt out lashes shouldn't be taken personal. Please advise.

## 2015-04-19 NOTE — Telephone Encounter (Signed)
Pt daughter/Susan,POA/called to inform that her mother getting worse/and aggressive//does she need a new med?  Call back @ [phone]

## 2015-04-19 NOTE — Telephone Encounter (Signed)
Message left for pt

## 2015-04-19 NOTE — Telephone Encounter (Signed)
Zyprexa is supposed to help with agitation.  She was previously on citalopram.  This can help with anxiety.  Why were these medications discontinued?  I think that Darl PikesSusan will need to get somebody, such as an aide or RN, to help around the house.  Did she ever contact [facility]?

## 2015-06-07 ENCOUNTER — Telehealth: Payer: Self-pay | Admitting: Neurology

## 2015-06-07 DIAGNOSIS — F0391 Unspecified dementia with behavioral disturbance: Secondary | ICD-10-CM

## 2015-06-07 DIAGNOSIS — F03918 Unspecified dementia, unspecified severity, with other behavioral disturbance: Secondary | ICD-10-CM

## 2015-06-07 DIAGNOSIS — R569 Unspecified convulsions: Secondary | ICD-10-CM

## 2015-06-07 MED ORDER — MEMANTINE HCL ER 28 MG PO CP24: 28.0000 mg | ORAL_CAPSULE | Freq: Every day | ORAL | Status: DC

## 2015-06-07 MED ORDER — LEVETIRACETAM ER 500 MG PO TB24: 500.0000 mg | ORAL_TABLET | Freq: Every day | ORAL | Status: DC

## 2015-06-07 NOTE — Telephone Encounter (Signed)
Darl PikesSusan wanted refills. Refills were submitted.

## 2015-06-07 NOTE — Telephone Encounter (Signed)
VM-PT's daughter Christina Benitez called and left a message in regards to medication/Dawn CB# [phone]

## 2015-08-17 DIAGNOSIS — M5136 Other intervertebral disc degeneration, lumbar region: Secondary | ICD-10-CM | POA: Diagnosis not present

## 2015-08-17 DIAGNOSIS — E538 Deficiency of other specified B group vitamins: Secondary | ICD-10-CM | POA: Diagnosis not present

## 2015-08-17 DIAGNOSIS — R569 Unspecified convulsions: Secondary | ICD-10-CM | POA: Diagnosis not present

## 2015-08-17 DIAGNOSIS — N3941 Urge incontinence: Secondary | ICD-10-CM | POA: Diagnosis not present

## 2015-08-17 DIAGNOSIS — G309 Alzheimer's disease, unspecified: Secondary | ICD-10-CM | POA: Diagnosis not present

## 2015-08-17 DIAGNOSIS — I6523 Occlusion and stenosis of bilateral carotid arteries: Secondary | ICD-10-CM | POA: Diagnosis not present

## 2015-09-06 ENCOUNTER — Other Ambulatory Visit: Payer: Self-pay | Admitting: Neurology

## 2015-09-06 NOTE — Telephone Encounter (Signed)
Last OV: 02/17/15 Next OV: 0/0/0   Needs appointment. Will add comment to prescription with 1 month supplY!

## 2015-09-21 ENCOUNTER — Other Ambulatory Visit: Payer: Self-pay | Admitting: Neurology

## 2015-09-21 NOTE — Telephone Encounter (Signed)
Last OV: 02/17/15 Next OV: 0/0/00

## 2015-11-15 ENCOUNTER — Other Ambulatory Visit: Payer: Self-pay | Admitting: Neurology

## 2015-12-02 ENCOUNTER — Other Ambulatory Visit: Payer: Self-pay | Admitting: Neurology

## 2016-01-05 ENCOUNTER — Ambulatory Visit (INDEPENDENT_AMBULATORY_CARE_PROVIDER_SITE_OTHER): Payer: PPO | Admitting: Neurology

## 2016-01-05 ENCOUNTER — Encounter: Payer: Self-pay | Admitting: Neurology

## 2016-01-05 VITALS — BP 112/70 | HR 76 | Ht 66.0 in | Wt 180.0 lb

## 2016-01-05 DIAGNOSIS — G301 Alzheimer's disease with late onset: Secondary | ICD-10-CM

## 2016-01-05 DIAGNOSIS — F17200 Nicotine dependence, unspecified, uncomplicated: Secondary | ICD-10-CM

## 2016-01-05 DIAGNOSIS — F02818 Dementia in other diseases classified elsewhere, unspecified severity, with other behavioral disturbance: Secondary | ICD-10-CM

## 2016-01-05 DIAGNOSIS — F0281 Dementia in other diseases classified elsewhere with behavioral disturbance: Secondary | ICD-10-CM

## 2016-01-05 NOTE — Progress Notes (Signed)
NEUROLOGY FOLLOW UP OFFICE NOTE  Christina Benitez [id number]  HISTORY OF PRESENT ILLNESS: Christina Benitez is an 82 year old right-handed woman with hyperlipidemia, hyperthyroidism, TIA, depression, GERD, arthritis, former smoker and dementia who follows up for Alzheimer's dementia and transient altered awareness, possibly complex partial seizures.  She is accompanied by her daughter who provides some history.  UPDATE: She is taking Keppra extended release  daily and Namenda XR  daily.  She lives with her daughter and her daughter's boyfrien. She has been much more irritable and combative.  She easily will get angry and say hurtful things to her daughter.  She has had worsening of memory.  She will forget conversations just moments later.  Her daughter supervises her medication.  She was prescribed olanzapine at bedtime for agitation, but she refuses to take it.  Mostly, she is irritable and says hurtful things to her daughter.  However, she recognizes afterwards that she is being unfair.  She is social and sees friends every week.  HISTORY: She has had memory problems since around 2013. She often misplaces objects and she repeats questions. She sometimes forgets names of her friends and has even called her daughter by the name of her grandson. There are no hallucinations, delusions, or episodes of sundowning. She lives by herself in an apartment but her daughter comes over to see her almost daily. Her daughter setup her medications and 8 pillbox and she is able to take them correctly. She is able to perform all her activities of daily living. She keeps the housecleaning. She walks or dogs regularly. She never leaves the stove on. She was handling her own finances up until a month ago. She does write out her ranch check and uses [employer] hard. However she began over drawing from her checking account. Now her daughter handles balancing her account in pain the bills. She only drives about a half  mile to the grocery store. She has not had any episodes of disorientation while driving or near accidents. She was previously started on Exelon patch which she had a bad side effects so this was discontinued. It apparently made her more confused. Seroquel also caused more confusion.  There is no known family history of dementia.  B12 level from 12/08/13 was 185 and she was started on B12 injections, but only had one injection a last month.  TSH was 2.41.    Seroquel caused more confusion, so it was discontinued.  She also had 2 episodes of transient altered awareness in June. Around 12/09/13, she was riding in the passenger seat with her daughter's boyfriend. Suddenly, she became silent and started leaning, just staring across. She was unresponsive. This lasted for a couple of minutes. There was no associated abnormal movements or convulsions. There was no associated tongue biting or incontinence. She was confused for a couple minutes afterwards and was amnestic to the event. She was admitted to [facility] on 12/14/13 for altered mental status.  She had a similar episode in a restaurant while she was with her daughter. She suddenly became unresponsive, staring across, her body tilting sideways. Presentation was almost identical to the prior episode. Afterwards she was confused but she was able to follow commands and talk. She felt sick and had nausea and episode of emesis.  CT of the head was unremarkable.  MRI of the brain revealed minimal white matter disease and mild generalized atrophy, but no acute infarct or bleed.  EEG revealed nonspecific generalized slowing, but no epileptiform  activity.  She has no past history of seizures.  PAST MEDICAL HISTORY: Past Medical History  Diagnosis Date  . TIA (transient ischemic attack)     MEDICATIONS: Current Outpatient Prescriptions on File Prior to Visit  Medication Sig Dispense Refill  . aspirin 81 MG chewable tablet Chew 81 mg by mouth daily.    .  citalopram (CELEXA) 40 MG tablet Take 40 mg by mouth daily.    . cyanocobalamin (,VITAMIN B-12,) 1000 MCG/ML injection 1000 MCG IM QD FOR 7 DAYS, THEN 1000 MCG IM 1 X WEEK FOR 4 WEEKS, THEN 1000 MCG IM Q MONTH FOR 1 YEAR 30 mL 0  . HYDROcodone-acetaminophen (NORCO) 10-325 MG per tablet Take 0.5 tablets by mouth 2 (two) times daily as needed. For pain 30 tablet 0  . levETIRAcetam (KEPPRA XR) 500 MG 24 hr tablet Take 1 tablet (500 mg total) by mouth 2 (two) times daily. (Patient taking differently: Take 500 mg by mouth daily. ) 60 tablet 2  . levothyroxine (SYNTHROID, LEVOTHROID) 100 MCG tablet TK 1 T PO QAM ON AN EMPTY STOMACH  7  . memantine (NAMENDA XR) 28 MG CP24 24 hr capsule Take 1 capsule (28 mg total) by mouth daily. 90 capsule 0  . Memantine HCl ER 7 MG CP24 Take 1cap qd x7d, then 2caps qd x7d, then 3caps qd x7d, then call for refill. 42 capsule 0  . Vitamin D, Cholecalciferol, 1000 UNITS TABS Take 1,000 Units by mouth daily.     No current facility-administered medications on file prior to visit.    ALLERGIES: Allergies  Allergen Reactions  . Sulfa Antibiotics Hives    FAMILY HISTORY: No history of stroke, epilepsy or dementia  SOCIAL HISTORY: Social History   Social History  . Marital Status: Widowed    Spouse Name: N/A  . Number of Children: N/A  . Years of Education: N/A   Occupational History  . Not on file.   Social History Main Topics  . Smoking status: Current Some Day Smoker -- 0.50 packs/day    Types: Cigarettes    Last Attempt to Quit: 12/10/2013  . Smokeless tobacco: Not on file  . Alcohol Use: No  . Drug Use: No  . Sexual Activity: No   Other Topics Concern  . Not on file   Social History Narrative    REVIEW OF SYSTEMS: Constitutional: No fevers, chills, or sweats, no generalized fatigue, change in appetite Eyes: No visual changes, double vision, eye pain Ear, nose and throat: No hearing loss, ear pain, nasal congestion, sore  throat Cardiovascular: No chest pain, palpitations Respiratory:  No shortness of breath at rest or with exertion, wheezes GastrointestinaI: No nausea, vomiting, diarrhea, abdominal pain, fecal incontinence Genitourinary:  No dysuria, urinary retention or frequency Musculoskeletal:  No neck pain, back pain Integumentary: No rash, pruritus, skin lesions Neurological: as above Psychiatric: No depression, insomnia, anxiety Endocrine: No palpitations, fatigue, diaphoresis, mood swings, change in appetite, change in weight, increased thirst Hematologic/Lymphatic:  No purpura, petechiae. Allergic/Immunologic: no itchy/runny eyes, nasal congestion, recent allergic reactions, rashes  PHYSICAL EXAM: Filed Vitals:   01/05/16 1356  BP: 112/70  Pulse: 76   General: No acute distress.  Patient appears well-groomed.  normal body habitus. Head:  Normocephalic/atraumatic Eyes:  Fundi examined but not visualized Neck: supple, no paraspinal tenderness, full range of motion Heart:  Regular rate and rhythm Lungs:  Clear to auscultation bilaterally Back: No paraspinal tenderness Neurological Exam: alert and oriented to person only except state. Attention span  and concentration impaired, recent memory impaired, remote memory intact, fund of knowledge is fair.  Speech fluent and not dysarthric, language intact.    MMSE - Mini Mental State Exam 01/05/2016 02/17/2015  Orientation to time 1 0  Orientation to Place 2 1  Registration 3 3  Attention/ Calculation 3 5  Recall 0 0  Language- name 2 objects 2 2  Language- repeat 1 1  Language- follow 3 step command 3 3  Language- read & follow direction 1 1  Write a sentence 1 1  Copy design 0 1  Total score 17 18   CN II-XII intact. Bulk and tone normal, muscle strength 5/5 throughout.  Sensation to light touch intact.  Deep tendon reflexes 2+ throughout, toes downgoing.  Finger to nose testing intact.  Gait normal  IMPRESSION: Alzheimer's disease Spells.   Uncertain etiology and not sure if they were seizures.  She has not had recurrent spells and Keppra may be contributing to irritability.  Will see if we can discontinue Keppra.  PLAN: 1.  Will check EEG.  If unremarkable, will discontinue Keppra 2.  Continue Namenda and Celexa 3.  Supervision 4.  Smoking cessation 5.  Follow up in 6 months.  28 minutes spent face to face with patient, over 50% spent counseling.  Christina Millet, DO  CC:  Mila Palmer, MD

## 2016-01-05 NOTE — Patient Instructions (Addendum)
1.  Continue Namenda and Celexa 2.  We will get another EEG.  If the EEG looks okay, I would like to stop the Keppra.  Hopefully, this will help your mood. 3.  Follow up in 6 months.

## 2016-01-05 NOTE — Progress Notes (Signed)
Chart forwarded.  

## 2016-01-07 NOTE — Addendum Note (Signed)
Addended by: Sheilah MinsFOX, Anup Brigham A on: 01/07/2016 07:27 AM   Modules accepted: Orders

## 2016-01-10 ENCOUNTER — Ambulatory Visit (INDEPENDENT_AMBULATORY_CARE_PROVIDER_SITE_OTHER): Payer: PPO | Admitting: Neurology

## 2016-01-10 ENCOUNTER — Telehealth: Payer: Self-pay

## 2016-01-10 DIAGNOSIS — F0281 Dementia in other diseases classified elsewhere with behavioral disturbance: Secondary | ICD-10-CM

## 2016-01-10 DIAGNOSIS — G301 Alzheimer's disease with late onset: Secondary | ICD-10-CM | POA: Diagnosis not present

## 2016-01-10 DIAGNOSIS — F17200 Nicotine dependence, unspecified, uncomplicated: Secondary | ICD-10-CM

## 2016-01-10 DIAGNOSIS — F02818 Dementia in other diseases classified elsewhere, unspecified severity, with other behavioral disturbance: Secondary | ICD-10-CM

## 2016-01-10 NOTE — Telephone Encounter (Signed)
-----   Message from Drema Dallas, DO sent at 01/10/2016  2:56 PM EDT ----- EEG is normal.  She may discontinue Keppra and monitor for seizures.

## 2016-01-10 NOTE — Procedures (Signed)
ELECTROENCEPHALOGRAM REPORT  Date of Study: 01/10/2016  Patient's Name: JERYN RAYBORN MRN: [id number] Date of Birth: 1932-04-13  Indication: 82 year old female with past medical history of TIA and tobacco abuse with Alzheimer's disease and 2 episodes of transient altered awareness for a couple of minutes with confusion afterwards and was amnestic to the event.  Medications: Keppra Namenda XR Levothyroxine Norco  Technical Summary: This is a multichannel digital EEG recording, using the international 10-20 placement system with electrodes applied with paste and impedances below 5000 ohms.    Description: The EEG background is symmetric, with a well-developed posterior dominant rhythm of 8 Hz, which is reactive to eye opening and closing.  Diffuse beta activity is seen, with a bilateral frontal preponderance.  No focal or generalized abnormalities are seen.  No focal or generalized epileptiform discharges are seen.  Stage II sleep is not seen.  Hyperventilation and photic stimulation were performed, and produced no abnormalities.  ECG revealed normal cardiac rate and rhythm.  Impression: This is a normal routine EEG of the awake and drowsy states, with activating procedures.  A normal study does not rule out the possibility of a seizure disorder in this patient.  Berkeley Vanaken R. Everlena Cooper, DO

## 2016-01-10 NOTE — Telephone Encounter (Signed)
Daughter aware. Med removed from list.

## 2016-02-29 DIAGNOSIS — F17200 Nicotine dependence, unspecified, uncomplicated: Secondary | ICD-10-CM | POA: Diagnosis not present

## 2016-02-29 DIAGNOSIS — M25569 Pain in unspecified knee: Secondary | ICD-10-CM | POA: Diagnosis not present

## 2016-02-29 DIAGNOSIS — M40204 Unspecified kyphosis, thoracic region: Secondary | ICD-10-CM | POA: Diagnosis not present

## 2016-02-29 DIAGNOSIS — I6529 Occlusion and stenosis of unspecified carotid artery: Secondary | ICD-10-CM | POA: Diagnosis not present

## 2016-02-29 DIAGNOSIS — M5136 Other intervertebral disc degeneration, lumbar region: Secondary | ICD-10-CM | POA: Diagnosis not present

## 2016-02-29 DIAGNOSIS — I771 Stricture of artery: Secondary | ICD-10-CM | POA: Diagnosis not present

## 2016-02-29 DIAGNOSIS — G309 Alzheimer's disease, unspecified: Secondary | ICD-10-CM | POA: Diagnosis not present

## 2016-02-29 DIAGNOSIS — E538 Deficiency of other specified B group vitamins: Secondary | ICD-10-CM | POA: Diagnosis not present

## 2016-03-06 ENCOUNTER — Other Ambulatory Visit: Payer: Self-pay | Admitting: Neurology

## 2016-03-07 ENCOUNTER — Other Ambulatory Visit: Payer: Self-pay | Admitting: *Deleted

## 2016-03-07 DIAGNOSIS — F0391 Unspecified dementia with behavioral disturbance: Secondary | ICD-10-CM

## 2016-03-07 DIAGNOSIS — R569 Unspecified convulsions: Secondary | ICD-10-CM

## 2016-03-07 DIAGNOSIS — F03918 Unspecified dementia, unspecified severity, with other behavioral disturbance: Secondary | ICD-10-CM

## 2016-03-07 MED ORDER — MEMANTINE HCL ER 28 MG PO CP24: 28.0000 mg | ORAL_CAPSULE | Freq: Every day | ORAL | 0 refills | Status: DC

## 2016-03-07 MED ORDER — MEMANTINE HCL ER 7 MG PO CP24: ORAL_CAPSULE | ORAL | 0 refills | Status: DC

## 2016-03-07 NOTE — Telephone Encounter (Signed)
Rx sent 

## 2016-03-13 ENCOUNTER — Telehealth: Payer: Self-pay | Admitting: Neurology

## 2016-03-13 MED ORDER — MEMANTINE HCL ER 28 MG PO CP24: 28.0000 mg | ORAL_CAPSULE | Freq: Every day | ORAL | 2 refills | Status: DC

## 2016-03-13 NOTE — Telephone Encounter (Signed)
RX sent to pharmacy  

## 2016-03-13 NOTE — Telephone Encounter (Signed)
Patient daughter called and states that we need to call in a 90 day supply of the namenda xr 28 mg. Please call patient daughter at [phone] susan

## 2016-03-14 DIAGNOSIS — E7800 Pure hypercholesterolemia, unspecified: Secondary | ICD-10-CM | POA: Diagnosis not present

## 2016-03-14 DIAGNOSIS — E039 Hypothyroidism, unspecified: Secondary | ICD-10-CM | POA: Diagnosis not present

## 2016-03-14 DIAGNOSIS — K219 Gastro-esophageal reflux disease without esophagitis: Secondary | ICD-10-CM | POA: Diagnosis not present

## 2016-03-14 DIAGNOSIS — M5136 Other intervertebral disc degeneration, lumbar region: Secondary | ICD-10-CM | POA: Diagnosis not present

## 2016-03-14 DIAGNOSIS — F329 Major depressive disorder, single episode, unspecified: Secondary | ICD-10-CM | POA: Diagnosis not present

## 2016-03-14 DIAGNOSIS — Z7982 Long term (current) use of aspirin: Secondary | ICD-10-CM | POA: Diagnosis not present

## 2016-03-14 DIAGNOSIS — G309 Alzheimer's disease, unspecified: Secondary | ICD-10-CM | POA: Diagnosis not present

## 2016-03-14 DIAGNOSIS — F0280 Dementia in other diseases classified elsewhere without behavioral disturbance: Secondary | ICD-10-CM | POA: Diagnosis not present

## 2016-07-11 ENCOUNTER — Ambulatory Visit: Payer: Self-pay | Admitting: Neurology

## 2016-11-16 DIAGNOSIS — Z79899 Other long term (current) drug therapy: Secondary | ICD-10-CM | POA: Diagnosis not present

## 2016-11-16 DIAGNOSIS — E559 Vitamin D deficiency, unspecified: Secondary | ICD-10-CM | POA: Diagnosis not present

## 2016-11-16 DIAGNOSIS — F324 Major depressive disorder, single episode, in partial remission: Secondary | ICD-10-CM | POA: Diagnosis not present

## 2016-11-16 DIAGNOSIS — Z0000 Encounter for general adult medical examination without abnormal findings: Secondary | ICD-10-CM | POA: Diagnosis not present

## 2016-11-16 DIAGNOSIS — G894 Chronic pain syndrome: Secondary | ICD-10-CM | POA: Diagnosis not present

## 2016-11-16 DIAGNOSIS — E039 Hypothyroidism, unspecified: Secondary | ICD-10-CM | POA: Diagnosis not present

## 2016-11-16 DIAGNOSIS — E538 Deficiency of other specified B group vitamins: Secondary | ICD-10-CM | POA: Diagnosis not present

## 2016-11-16 DIAGNOSIS — E782 Mixed hyperlipidemia: Secondary | ICD-10-CM | POA: Diagnosis not present

## 2017-01-30 ENCOUNTER — Other Ambulatory Visit: Payer: Self-pay | Admitting: Neurology

## 2017-05-15 DIAGNOSIS — G894 Chronic pain syndrome: Secondary | ICD-10-CM | POA: Diagnosis not present

## 2017-05-15 DIAGNOSIS — G309 Alzheimer's disease, unspecified: Secondary | ICD-10-CM | POA: Diagnosis not present

## 2017-07-31 ENCOUNTER — Other Ambulatory Visit: Payer: Self-pay | Admitting: Neurology

## 2017-08-01 ENCOUNTER — Other Ambulatory Visit: Payer: Self-pay | Admitting: Neurology

## 2017-08-10 ENCOUNTER — Other Ambulatory Visit: Payer: Self-pay | Admitting: Neurology

## 2017-08-13 ENCOUNTER — Telehealth: Payer: Self-pay | Admitting: Neurology

## 2017-08-13 ENCOUNTER — Other Ambulatory Visit: Payer: Self-pay | Admitting: *Deleted

## 2017-08-13 MED ORDER — MEMANTINE HCL ER 28 MG PO CP24: 28.0000 mg | ORAL_CAPSULE | Freq: Every day | ORAL | 0 refills | Status: DC

## 2017-08-13 NOTE — Telephone Encounter (Signed)
Patient daughter called made appt for the patient to see dr Everlena CooperJaffe on 11-26-17 and would like to have enough medication Namenda called  In to get her to the appt

## 2017-08-13 NOTE — Telephone Encounter (Signed)
Rx sent in for 90 day supply

## 2017-08-15 ENCOUNTER — Ambulatory Visit: Payer: PPO | Admitting: Neurology

## 2017-08-15 ENCOUNTER — Encounter: Payer: Self-pay | Admitting: Psychology

## 2017-08-15 VITALS — BP 104/60 | HR 78 | Ht 66.0 in | Wt 182.0 lb

## 2017-08-15 DIAGNOSIS — G301 Alzheimer's disease with late onset: Secondary | ICD-10-CM | POA: Diagnosis not present

## 2017-08-15 DIAGNOSIS — F0281 Dementia in other diseases classified elsewhere with behavioral disturbance: Secondary | ICD-10-CM

## 2017-08-15 NOTE — Progress Notes (Signed)
I met with the patient and her daughter while they were in the clinic today.  I do not have a history with this patient or family, but it is apparent that they are living situation is not working out and possibly that the patient is having behavioral issues with her Alzheimer's disease.  I provided them with information on Alzheimer's and dementia and recommended support groups the 24/7 helpline.  I am not sure if they are looking into other living arrangements for the patient or strategies to continue to have the patient live at home.  The patient verbalized that the living situation was not working out for her.   Resources provided:  Alzheimer's Association 24 7 helpline where they can speak to a specialized information and referral specialist.  They will also have information on assisted living and memory care and alternative living situations for the patient. [facility] as they have respite care and adult day program and offer a significant amount of caregiver support.  They also have support groups for caregivers.  [facility] as they offer a day program as well.  In addition, I put resources in the mail for the daughter on  dementia behaviors and how to manage them from the ALZ association.

## 2017-08-15 NOTE — Patient Instructions (Signed)
Continue the Namenda  Ask Dr. Paulino RilyWolters about switching from citalopram to Prozac Contact the Alzheimer's Association number Follow up in 6 months.

## 2017-08-16 ENCOUNTER — Encounter: Payer: Self-pay | Admitting: Neurology

## 2017-08-16 NOTE — Progress Notes (Signed)
NEUROLOGY FOLLOW UP OFFICE NOTE  VEGAS COFFIN [id number]  HISTORY OF PRESENT ILLNESS: Christina Benitez is an 84 year old right-handed woman with hyperlipidemia, hyperthyroidism, TIA, depression, GERD, arthritis, former smoker and dementia who follows up for Alzheimer's dementia and transient altered awareness, possibly complex partial seizures.  She is accompanied by her daughter, Christina Benitez, who provides some history.   UPDATE: [name] hasn't been seen since July 2017.  She is taking and Namenda XR 28mg  daily.  EEG from 01/10/16 was normal, so Keppra was discontinued.  She lives with Christina Benitez and her daughter's husband.  She is able to bath herself but often needs prompting.  She is able to use the toilet and dress herself.  Christina Benitez needs to administer her medications.  Sometimes she refuses to take them.  If she misses a dose of citalopram for one day, her mood changes for the worse.  She is not able to use the stove or cook for herself, so Christina Benitez has taken on that role.  It has been very difficult for her daughter.  She says that [name] is at times hostile and says hurtful things.  She states that she doesn't want to live with her daughter.  She is feeling overwhelmed.  HISTORY: She has had memory problems since around 2013. She often misplaces objects and she repeats questions. She sometimes forgets names of her friends and has even called her daughter by the name of her grandson. There are no hallucinations, delusions, or episodes of sundowning. She lives by herself in an apartment but her daughter comes over to see her almost daily. Her daughter setup her medications and 8 pillbox and she is able to take them correctly. She is able to perform all her activities of daily living. She keeps the housecleaning. She walks or dogs regularly. She never leaves the stove on. She was handling her own finances up until a month ago. She does write out her ranch check and uses [employer] hard. However she began over  drawing from her checking account. Now her daughter handles balancing her account in pain the bills. She only drives about a half mile to the grocery store. She has not had any episodes of disorientation while driving or near accidents. She was previously started on Exelon patch which she had a bad side effects so this was discontinued. It apparently made her more confused. Seroquel also caused more confusion.  There is no known family history of dementia.  B12 level from 12/08/13 was 185 and she was started on B12 injections, but only had one injection a last month.  TSH was 2.41.     Seroquel caused more confusion, so it was discontinued.   She also had 2 episodes of transient altered awareness.  Around 12/09/13, she was riding in the passenger seat with her daughter's husband. Suddenly, she became silent and started leaning, just staring across. She was unresponsive. This lasted for a couple of minutes. There was no associated abnormal movements or convulsions. There was no associated tongue biting or incontinence. She was confused for a couple minutes afterwards and was amnestic to the event. She was admitted to [facility] on 12/14/13 for altered mental status.  She had a similar episode in a restaurant while she was with her daughter. She suddenly became unresponsive, staring across, her body tilting sideways. Presentation was almost identical to the prior episode. Afterwards she was confused but she was able to follow commands and talk. She felt sick and had nausea  and episode of emesis.  CT of the head was unremarkable.  MRI of the brain revealed minimal white matter disease and mild generalized atrophy, but no acute infarct or bleed.  EEG revealed nonspecific generalized slowing, but no epileptiform activity.  She has no past history of seizures.  PAST MEDICAL HISTORY: Past Medical History:  Diagnosis Date  . TIA (transient ischemic attack)     MEDICATIONS: Current Outpatient Medications on File  Prior to Visit  Medication Sig Dispense Refill  . aspirin 81 MG chewable tablet Chew 81 mg by mouth daily.    . citalopram (CELEXA) 40 MG tablet Take 40 mg by mouth daily.    Marland Kitchen. HYDROcodone-acetaminophen (NORCO) 10-325 MG per tablet Take 0.5 tablets by mouth 2 (two) times daily as needed. For pain 30 tablet 0  . levothyroxine (SYNTHROID, LEVOTHROID) 100 MCG tablet TK 1 T PO QAM ON AN EMPTY STOMACH  7  . memantine (NAMENDA XR) 28 MG CP24 24 hr capsule Take 1 capsule (28 mg total) by mouth daily. 90 capsule 0  . Vitamin D, Cholecalciferol, 1000 UNITS TABS Take 1,000 Units by mouth daily.     No current facility-administered medications on file prior to visit.     ALLERGIES: Allergies  Allergen Reactions  . Sulfa Antibiotics Hives    FAMILY HISTORY: No family history on file.  SOCIAL HISTORY: Social History   Socioeconomic History  . Marital status: Widowed    Spouse name: Not on file  . Number of children: Not on file  . Years of education: Not on file  . Highest education level: Not on file  Social Needs  . Financial resource strain: Not on file  . Food insecurity - worry: Not on file  . Food insecurity - inability: Not on file  . Transportation needs - medical: Not on file  . Transportation needs - non-medical: Not on file  Occupational History  . Not on file  Tobacco Use  . Smoking status: Current Some Day Smoker    Packs/day: 0.50    Types: Cigarettes    Last attempt to quit: 12/10/2013    Years since quitting: 3.6  Substance and Sexual Activity  . Alcohol use: No  . Drug use: No  . Sexual activity: No  Other Topics Concern  . Not on file  Social History Narrative  . Not on file    REVIEW OF SYSTEMS: Constitutional: No fevers, chills, or sweats, no generalized fatigue, change in appetite Eyes: No visual changes, double vision, eye pain Ear, nose and throat: No hearing loss, ear pain, nasal congestion, sore throat Cardiovascular: No chest pain,  palpitations Respiratory:  No shortness of breath at rest or with exertion, wheezes GastrointestinaI: No nausea, vomiting, diarrhea, abdominal pain, fecal incontinence Genitourinary:  No dysuria, urinary retention or frequency Musculoskeletal:  No neck pain, back pain Integumentary: No rash, pruritus, skin lesions Neurological: as above Psychiatric: No depression, insomnia, anxiety Endocrine: No palpitations, fatigue, diaphoresis, mood swings, change in appetite, change in weight, increased thirst Hematologic/Lymphatic:  No purpura, petechiae. Allergic/Immunologic: no itchy/runny eyes, nasal congestion, recent allergic reactions, rashes  PHYSICAL EXAM: Vitals:   08/15/17 1451  BP: 104/60  Pulse: 78  SpO2: 98%   General: No acute distress.  Patient appears well-groomed.   Head:  Normocephalic/atraumatic Eyes:  Fundi examined but not visualized Neck: supple, no paraspinal tenderness, full range of motion Heart:  Regular rate and rhythm Lungs:  Clear to auscultation bilaterally Back: No paraspinal tenderness Neurological Exam: alert and oriented  to self only (except for season). Attention span and concentration impaired, recent memory poor, remote memory intact, fund of knowledge intact.  Speech fluent and not dysarthric, language intact.   MMSE - Mini Mental State Exam 08/15/2017 01/05/2016 02/17/2015  Orientation to time 1 1 0  Orientation to Place 0 2 1  Registration 3 3 3   Attention/ Calculation 2 3 5   Recall 0 0 0  Language- name 2 objects 2 2 2   Language- repeat 1 1 1   Language- follow 3 step command 2 3 3   Language- read & follow direction 0 1 1  Write a sentence 0 1 1  Copy design 0 0 1  Total score 11 17 18    CN II-XII intact. Bulk and tone normal, muscle strength 5/5 throughout.  Sensation to light touch  intact.  Deep tendon reflexes 2+ throughout.  Finger to nose testing intact.  Gait normal  IMPRESSION: Alzheimer's disease, which has progressed since last visit in  July 2017.  PLAN: 1.  She will continue Namenda XR 28mg  daily. 2.  She was seen by our social worker and her daughter was provided information for resources, including the 24 hour number by the Alzheimer's Association.  She was encouraged to contact them for guidance on what is best for patient and her daughter.  Perhaps Lynnelle living in assisted living would be more appropriate since the dynamic between them has deteriorated at home.   3.  Christina Benitez will check with Dr.Wolters about switching citalopram to an SSRI with longer half-life, so it won't affect Tysha if she misses a dose once.  She is uncertain which previous antidepressants were tried in the past. 4.  Follow up in 6 months.  30 minutes spent face to face with patient, over 50% spent discussing management.  Shon Millet, DO  CC: Mila Palmer, MD

## 2017-10-04 ENCOUNTER — Telehealth: Payer: Self-pay | Admitting: Neurology

## 2017-10-04 NOTE — Telephone Encounter (Signed)
Pt's daughter called and said pt is having some outbursts and wants to know if there is some medication for her to take to calm her down, pt's daughter is heading to work and is ok to leave a VM message if she does not pick up

## 2017-10-04 NOTE — Telephone Encounter (Signed)
Called and spoke with Darl PikesSusan. She states her mothers outbursts have gotten worse, "this must be her new norm" wanted to ask for Ativan to help calm the Pt down at night. I advsd her she should contact Dr Paulino RilyWolters for this. She said she actually had them on the other line and will ask them.

## 2017-10-09 DIAGNOSIS — N3000 Acute cystitis without hematuria: Secondary | ICD-10-CM | POA: Diagnosis not present

## 2017-10-09 DIAGNOSIS — F324 Major depressive disorder, single episode, in partial remission: Secondary | ICD-10-CM | POA: Diagnosis not present

## 2017-10-09 DIAGNOSIS — G309 Alzheimer's disease, unspecified: Secondary | ICD-10-CM | POA: Diagnosis not present

## 2017-11-15 ENCOUNTER — Other Ambulatory Visit: Payer: Self-pay | Admitting: Neurology

## 2017-11-26 ENCOUNTER — Ambulatory Visit: Payer: PPO | Admitting: Neurology

## 2017-11-26 ENCOUNTER — Encounter

## 2017-11-30 ENCOUNTER — Other Ambulatory Visit: Payer: Self-pay | Admitting: Neurology

## 2018-02-11 NOTE — Progress Notes (Deleted)
NEUROLOGY FOLLOW UP OFFICE NOTE  Christina Benitez [id number]  HISTORY OF PRESENT ILLNESS: Christina Benitez is an 84 year old right-handed woman with hyperlipidemia, hyperthyroidism, TIA, depression, GERD, arthritis, former smoker and dementia who follows up for Alzheimer's dementia and transient altered awareness (possibly complex partial seizures).  She is accompanied by her daughter, Christina Benitez, who supplements history.   UPDATE: She is taking Namenda XR 28mg  daily.   She lives with Christina Benitez and her son-in-law.  ***  HISTORY: She has had memory problems since around 2013. She often misplaces objects and she repeats questions. She sometimes forgets names of her friends and has even called her daughter by the name of her grandson. There are no hallucinations, delusions, or episodes of sundowning. She lives by herself in an apartment but her daughter comes over to see her almost daily. Her daughter setup her medications and 8 pillbox and she is able to take them correctly. She is able to perform all her activities of daily living. She keeps the housecleaning. She walks or dogs regularly. She never leaves the stove on. She was handling her own finances up until a month ago. She does write out her ranch check and uses [employer] hard. However she began over drawing from her checking account. Now her daughter handles balancing her account in pain the bills. She only drives about a half mile to the grocery store. She has not had any episodes of disorientation while driving or near accidents. She was previously started on Exelon patch which she had a bad side effects so this was discontinued. It apparently made her more confused. Seroquel also caused more confusion.  There is no known family history of dementia. B12 level from 12/08/13 was 185 and she was started on B12 injections, but only had one injection a last month. TSH was 2.41.   Seroquel caused more confusion, so it was discontinued.  She also had 2  episodes of transient altered awareness.  Around 12/09/13, she was riding in the passenger seat with her daughter's husband. Suddenly, she became silent and started leaning, just staring across. She was unresponsive. This lasted for a couple of minutes. There was no associated abnormal movements or convulsions. There was no associated tongue biting or incontinence. She was confused for a couple minutes afterwards and was amnestic to the event. She was admitted to [facility] on 12/14/13 for altered mental status. She had a similar episode in a restaurant while she was with her daughter. She suddenly became unresponsive, staring across, her body tilting sideways. Presentation was almost identical to the prior episode. Afterwards she was confused but she was able to follow commands and talk. She felt sick and had nausea and episode of emesis. CT of the head was unremarkable. MRI of the brain revealed minimal white matter disease and mild generalized atrophy, but no acute infarct or bleed. EEG revealed nonspecific generalized slowing, but no epileptiform activity. She has no past history of seizures.  EEG from 01/10/16 was normal.  As she had no recurrent spells, Keppra was discontinued.  PAST MEDICAL HISTORY: Past Medical History:  Diagnosis Date  . TIA (transient ischemic attack)     MEDICATIONS: Current Outpatient Medications on File Prior to Visit  Medication Sig Dispense Refill  . aspirin 81 MG chewable tablet Chew 81 mg by mouth daily.    . citalopram (CELEXA) 40 MG tablet Take 40 mg by mouth daily.    Marland Kitchen HYDROcodone-acetaminophen (NORCO) 10-325 MG per tablet Take 0.5 tablets  by mouth 2 (two) times daily as needed. For pain 30 tablet 0  . levothyroxine (SYNTHROID, LEVOTHROID) 100 MCG tablet TK 1 T PO QAM ON AN EMPTY STOMACH  7  . memantine (NAMENDA XR) 28 MG CP24 24 hr capsule Take 1 capsule (28 mg total) by mouth daily. 90 capsule 0  . memantine (NAMENDA XR) 28 MG CP24 24 hr capsule TAKE 1  CAPSULE BY MOUTH DAILY 90 capsule 1  . memantine (NAMENDA XR) 28 MG CP24 24 hr capsule TAKE ONE CAPSULE BY MOUTH EVERY DAY 90 capsule 1  . Vitamin D, Cholecalciferol, 1000 UNITS TABS Take 1,000 Units by mouth daily.     No current facility-administered medications on file prior to visit.     ALLERGIES: Allergies  Allergen Reactions  . Sulfa Antibiotics Hives    FAMILY HISTORY: No family history on file. ***.  SOCIAL HISTORY: Social History   Socioeconomic History  . Marital status: Widowed    Spouse name: Not on file  . Number of children: Not on file  . Years of education: Not on file  . Highest education level: Not on file  Occupational History  . Not on file  Social Needs  . Financial resource strain: Not on file  . Food insecurity:    Worry: Not on file    Inability: Not on file  . Transportation needs:    Medical: Not on file    Non-medical: Not on file  Tobacco Use  . Smoking status: Current Some Day Smoker    Packs/day: 0.50    Types: Cigarettes    Last attempt to quit: 12/10/2013    Years since quitting: 4.1  . Smokeless tobacco: Never Used  Substance and Sexual Activity  . Alcohol use: No  . Drug use: No  . Sexual activity: Never  Lifestyle  . Physical activity:    Days per week: Not on file    Minutes per session: Not on file  . Stress: Not on file  Relationships  . Social connections:    Talks on phone: Not on file    Gets together: Not on file    Attends religious service: Not on file    Active member of club or organization: Not on file    Attends meetings of clubs or organizations: Not on file    Relationship status: Not on file  . Intimate partner violence:    Fear of current or ex partner: Not on file    Emotionally abused: Not on file    Physically abused: Not on file    Forced sexual activity: Not on file  Other Topics Concern  . Not on file  Social History Narrative  . Not on file    REVIEW OF SYSTEMS: Constitutional: No  fevers, chills, or sweats, no generalized fatigue, change in appetite Eyes: No visual changes, double vision, eye pain Ear, nose and throat: No hearing loss, ear pain, nasal congestion, sore throat Cardiovascular: No chest pain, palpitations Respiratory:  No shortness of breath at rest or with exertion, wheezes GastrointestinaI: No nausea, vomiting, diarrhea, abdominal pain, fecal incontinence Genitourinary:  No dysuria, urinary retention or frequency Musculoskeletal:  No neck pain, back pain Integumentary: No rash, pruritus, skin lesions Neurological: as above Psychiatric: No depression, insomnia, anxiety Endocrine: No palpitations, fatigue, diaphoresis, mood swings, change in appetite, change in weight, increased thirst Hematologic/Lymphatic:  No purpura, petechiae. Allergic/Immunologic: no itchy/runny eyes, nasal congestion, recent allergic reactions, rashes  PHYSICAL EXAM: There were no vitals filed for  this visit. General: No acute distress.  Patient appears ***-groomed.  *** body habitus. Head:  Normocephalic/atraumatic Eyes:  Fundi examined but not visualized Neck: supple, no paraspinal tenderness, full range of motion Heart:  Regular rate and rhythm Lungs:  Clear to auscultation bilaterally Back: No paraspinal tenderness Neurological Exam: alert and oriented to person, place, and time. Attention span and concentration intact, recent and remote memory intact, fund of knowledge intact.  Speech fluent and not dysarthric, language intact.  CN II-XII intact. Bulk and tone normal, muscle strength 5/5 throughout.  Sensation to light touch, temperature and vibration intact.  Deep tendon reflexes 2+ throughout, toes downgoing.  Finger to nose and heel to shin testing intact.  Gait normal, Romberg negative.  IMPRESSION: ***  PLAN: ***  Shon MilletAdam Jaffe, DO  CC: ***

## 2018-02-12 ENCOUNTER — Ambulatory Visit: Payer: PPO | Admitting: Neurology

## 2018-03-18 DIAGNOSIS — E538 Deficiency of other specified B group vitamins: Secondary | ICD-10-CM | POA: Diagnosis not present

## 2018-03-18 DIAGNOSIS — G894 Chronic pain syndrome: Secondary | ICD-10-CM | POA: Diagnosis not present

## 2018-03-18 DIAGNOSIS — E441 Mild protein-calorie malnutrition: Secondary | ICD-10-CM | POA: Diagnosis not present

## 2018-03-18 DIAGNOSIS — F324 Major depressive disorder, single episode, in partial remission: Secondary | ICD-10-CM | POA: Diagnosis not present

## 2018-03-18 DIAGNOSIS — G309 Alzheimer's disease, unspecified: Secondary | ICD-10-CM | POA: Diagnosis not present

## 2018-05-21 ENCOUNTER — Other Ambulatory Visit: Payer: Self-pay | Admitting: Neurology

## 2018-10-10 DIAGNOSIS — G309 Alzheimer's disease, unspecified: Secondary | ICD-10-CM | POA: Diagnosis not present

## 2018-10-10 DIAGNOSIS — F0391 Unspecified dementia with behavioral disturbance: Secondary | ICD-10-CM | POA: Diagnosis not present

## 2018-10-10 DIAGNOSIS — G894 Chronic pain syndrome: Secondary | ICD-10-CM | POA: Diagnosis not present

## 2018-10-10 DIAGNOSIS — M25569 Pain in unspecified knee: Secondary | ICD-10-CM | POA: Diagnosis not present

## 2018-11-03 DIAGNOSIS — F17200 Nicotine dependence, unspecified, uncomplicated: Secondary | ICD-10-CM | POA: Diagnosis not present

## 2018-11-03 DIAGNOSIS — R600 Localized edema: Secondary | ICD-10-CM | POA: Diagnosis not present

## 2018-11-03 DIAGNOSIS — Z79899 Other long term (current) drug therapy: Secondary | ICD-10-CM | POA: Diagnosis not present

## 2018-11-03 DIAGNOSIS — M7989 Other specified soft tissue disorders: Secondary | ICD-10-CM | POA: Diagnosis not present

## 2018-11-03 DIAGNOSIS — R4182 Altered mental status, unspecified: Secondary | ICD-10-CM | POA: Diagnosis not present

## 2018-11-03 DIAGNOSIS — Z7982 Long term (current) use of aspirin: Secondary | ICD-10-CM | POA: Diagnosis not present

## 2018-11-03 DIAGNOSIS — F0391 Unspecified dementia with behavioral disturbance: Secondary | ICD-10-CM | POA: Diagnosis not present

## 2018-11-13 ENCOUNTER — Ambulatory Visit: Payer: PPO | Admitting: Diagnostic Neuroimaging

## 2018-11-13 ENCOUNTER — Telehealth: Payer: Self-pay

## 2018-11-13 DIAGNOSIS — E559 Vitamin D deficiency, unspecified: Secondary | ICD-10-CM | POA: Diagnosis not present

## 2018-11-13 DIAGNOSIS — F329 Major depressive disorder, single episode, unspecified: Secondary | ICD-10-CM | POA: Diagnosis not present

## 2018-11-13 DIAGNOSIS — M5136 Other intervertebral disc degeneration, lumbar region: Secondary | ICD-10-CM | POA: Diagnosis not present

## 2018-11-13 DIAGNOSIS — F17210 Nicotine dependence, cigarettes, uncomplicated: Secondary | ICD-10-CM | POA: Diagnosis not present

## 2018-11-13 DIAGNOSIS — F0281 Dementia in other diseases classified elsewhere with behavioral disturbance: Secondary | ICD-10-CM | POA: Diagnosis not present

## 2018-11-13 DIAGNOSIS — M40204 Unspecified kyphosis, thoracic region: Secondary | ICD-10-CM | POA: Diagnosis not present

## 2018-11-13 DIAGNOSIS — M419 Scoliosis, unspecified: Secondary | ICD-10-CM | POA: Diagnosis not present

## 2018-11-13 DIAGNOSIS — Z7982 Long term (current) use of aspirin: Secondary | ICD-10-CM | POA: Diagnosis not present

## 2018-11-13 DIAGNOSIS — R296 Repeated falls: Secondary | ICD-10-CM | POA: Diagnosis not present

## 2018-11-13 DIAGNOSIS — Z8673 Personal history of transient ischemic attack (TIA), and cerebral infarction without residual deficits: Secondary | ICD-10-CM | POA: Diagnosis not present

## 2018-11-13 DIAGNOSIS — G894 Chronic pain syndrome: Secondary | ICD-10-CM | POA: Diagnosis not present

## 2018-11-13 DIAGNOSIS — E039 Hypothyroidism, unspecified: Secondary | ICD-10-CM | POA: Diagnosis not present

## 2018-11-13 DIAGNOSIS — G309 Alzheimer's disease, unspecified: Secondary | ICD-10-CM | POA: Diagnosis not present

## 2018-11-13 NOTE — Telephone Encounter (Signed)
I reached out to the pt and left a vm stating I was trying to reach Christina Benitez to complete pre charting for 11/14/18 appt scheduled with Dr. Terrace Arabia.  Pt was provided with [facility] hours and #.  Chart updated off new patient paper work received.

## 2018-11-14 ENCOUNTER — Ambulatory Visit (INDEPENDENT_AMBULATORY_CARE_PROVIDER_SITE_OTHER): Payer: PPO | Admitting: Neurology

## 2018-11-14 ENCOUNTER — Other Ambulatory Visit: Payer: Self-pay

## 2018-11-14 DIAGNOSIS — R410 Disorientation, unspecified: Secondary | ICD-10-CM

## 2018-11-14 DIAGNOSIS — F0281 Dementia in other diseases classified elsewhere with behavioral disturbance: Secondary | ICD-10-CM | POA: Diagnosis not present

## 2018-11-14 DIAGNOSIS — G309 Alzheimer's disease, unspecified: Secondary | ICD-10-CM | POA: Diagnosis not present

## 2018-11-14 MED ORDER — DIVALPROEX SODIUM ER 500 MG PO TB24: 500.0000 mg | ORAL_TABLET | Freq: Every day | ORAL | 11 refills | Status: DC

## 2018-11-14 MED ORDER — DONEPEZIL HCL 10 MG PO TABS: 10.0000 mg | ORAL_TABLET | Freq: Every day | ORAL | 11 refills | Status: DC

## 2018-11-14 MED ORDER — MEMANTINE HCL 10 MG PO TABS: 10.0000 mg | ORAL_TABLET | Freq: Two times a day (BID) | ORAL | 11 refills | Status: DC

## 2018-11-14 MED ORDER — MEMANTINE HCL-DONEPEZIL HCL 28-10 MG PO CP24
1.0000 | ORAL_CAPSULE | Freq: Every day | ORAL | 11 refills | Status: AC
Start: 2018-11-14 — End: ?

## 2018-11-14 NOTE — Progress Notes (Signed)
PATIENT: Christina Benitez DOB: 28-Dec-1931  Virtual Visit via video  I connected with Christina Benitez on 11/14/18 at  by video and verified that I am speaking with the correct person using two identifiers.   I discussed the limitations, risks, security and privacy concerns of performing an evaluation and management service by video and the availability of in person appointments. I also discussed with the patient that there may be a patient responsible charge related to this service. The patient expressed understanding and agreed to proceed.  HISTORICAL  Christina Benitez is a 85 years old female, seen in request by her primary care doctor Christina Benitez, for evaluation of dementia, she is accompanied by her daughter Christina Benitez during today's virtual visit  I have reviewed and summarized the referring note from the referring physician.  She had past medical history of depression, hypothyroidism, stroke,  She has lived with her daughter since 89, was noted around 2014, she has gradual onset memory loss, could no longer managing her medications, drink coffee from her soup bowl, has stopped cooking, lives her door open,  Her mother developed memory loss in her 36s, she has gradual worsening memory loss over the past few years, mild gait abnormality, difficulty cleaning herself up after using bathroom, frequent UTIs, sometimes mixing up day and night, agitation during evening time, she was treated with Risperdal 0.5 mg twice daily in April, has increased confusion, fell few times, she was given Xanax as needed for evening time agitations now  Since 2019, daughter also reported that she has spells of sudden onset staring into space, unresponsive, lasting for few minutes, then stop all of it  She has been taking Namenda XR 28 mg for years, but apparently planes of high co-pay, previously tried Aricept, had some GI side effect  I personally reviewed MRI brain in 2015 evidence of mild generalized  atrophy, small vessel disease, no acute abnormality.  Observations/Objective: I have reviewed problem lists, medications, allergies.  She is not oriented to year, her age, rely on her daughter to answer questions  Assessment and Plan:  Dementia with agitation Episode of confusion  Possible seizure  EEG  Depakote ER 500 mg daily  Changed to Namenda generic 10 mg twice a day, add on Aricept 10 mg daily       Follow Up Instructions:     I discussed the assessment and treatment plan with the patient. The patient was provided an opportunity to ask questions and all were answered. The patient agreed with the plan and demonstrated an understanding of the instructions.   The patient was advised to call back or seek an in-person evaluation if the symptoms worsen or if the condition fails to improve as anticipated.   I provided 45 minutes of non-face-to-face time during this encounter.  REVIEW OF SYSTEMS: Full 14 system review of systems performed and notable only for as above All other review of systems were negative.  ALLERGIES: Allergies  Allergen Reactions  . Codeine     Dizziness   . Donepezil Nausea And Vomiting  . Exelon [Rivastigmine Tartrate] Nausea And Vomiting  . Indomethacin Er     Upset stomach   . Risperidone And Related     Increased falls   . Sulfa Antibiotics Hives    HOME MEDICATIONS: Current Outpatient Medications  Medication Sig Dispense Refill  . aspirin 81 MG chewable tablet Chew 81 mg by mouth daily.    . citalopram (CELEXA) 40 MG tablet Take 40 mg  by mouth daily.    . Cyanocobalamin (VITAMIN B-12 IJ) Inject as directed. Inject every 30 days    . FLUoxetine (PROZAC) 40 MG capsule Take 40 mg by mouth daily.    Marland Kitchen HYDROcodone-acetaminophen (NORCO) 10-325 MG per tablet Take 0.5 tablets by mouth 2 (two) times daily as needed. For pain 30 tablet 0  . levothyroxine (SYNTHROID, LEVOTHROID) 100 MCG tablet TK 1 T PO QAM ON AN EMPTY STOMACH  7  . memantine  (NAMENDA XR) 28 MG CP24 24 hr capsule Take 1 capsule (28 mg total) by mouth daily. 90 capsule 0  . mirtazapine (REMERON) 7.5 MG tablet Take 7.5 mg by mouth at bedtime.    . Vitamin D, Cholecalciferol, 1000 UNITS TABS Take 1,000 Units by mouth daily.     No current facility-administered medications for this visit.     PAST MEDICAL HISTORY: Past Medical History:  Diagnosis Date  . Alzheimer disease (HCC)   . Deficiency of other specified B group vitamins   . Degenerative lumbar disc   . GERD (gastroesophageal reflux disease)   . Hypothyroid   . Knee pain, chronic   . Kyphosis of thoracic region   . Major depressive disorder   . Mixed hyperlipidemia   . Nicotine dependence   . Occlusion and stenosis of bilateral carotid arteries   . Partial seizure (HCC)   . Protein calorie malnutrition (HCC)   . Senile purpura (HCC)   . TIA (transient ischemic attack)   . Tortuous aorta (HCC)   . Urge incontinence   . Vitamin B12 deficiency   . Vitamin D deficiency     PAST SURGICAL HISTORY: Past Surgical History:  Procedure Laterality Date  . TONSILLECTOMY      FAMILY HISTORY: No family history on file.  SOCIAL HISTORY:   Social History   Socioeconomic History  . Marital status: Widowed    Spouse name: Not on file  . Number of children: 3  . Years of education: Not on file  . Highest education level: Not on file  Occupational History  . Not on file  Social Needs  . Financial resource strain: Not on file  . Food insecurity:    Worry: Not on file    Inability: Not on file  . Transportation needs:    Medical: Not on file    Non-medical: Not on file  Tobacco Use  . Smoking status: Current Some Day Smoker    Packs/day: 0.50    Types: Cigarettes  . Smokeless tobacco: Never Used  Substance and Sexual Activity  . Alcohol use: No  . Drug use: No  . Sexual activity: Never  Lifestyle  . Physical activity:    Days per week: Not on file    Minutes per session: Not on file   . Stress: Not on file  Relationships  . Social connections:    Talks on phone: Not on file    Gets together: Not on file    Attends religious service: Not on file    Active member of club or organization: Not on file    Attends meetings of clubs or organizations: Not on file    Relationship status: Not on file  . Intimate partner violence:    Fear of current or ex partner: Not on file    Emotionally abused: Not on file    Physically abused: Not on file    Forced sexual activity: Not on file  Other Topics Concern  . Not on file  Social History Narrative   1 cup of coffee per day     Levert FeinsteinYijun Amilee Janvier, M.D. Ph.D.  [facility] [address], [address] [place], [place] [address] Ph: [phone] Fax: [phone]  CC: Christina PalmerWolters, Sharon, MD

## 2018-11-15 ENCOUNTER — Encounter: Payer: Self-pay | Admitting: Neurology

## 2018-11-18 DIAGNOSIS — F17210 Nicotine dependence, cigarettes, uncomplicated: Secondary | ICD-10-CM | POA: Diagnosis not present

## 2018-11-18 DIAGNOSIS — M419 Scoliosis, unspecified: Secondary | ICD-10-CM | POA: Diagnosis not present

## 2018-11-18 DIAGNOSIS — F329 Major depressive disorder, single episode, unspecified: Secondary | ICD-10-CM | POA: Diagnosis not present

## 2018-11-18 DIAGNOSIS — E559 Vitamin D deficiency, unspecified: Secondary | ICD-10-CM | POA: Diagnosis not present

## 2018-11-18 DIAGNOSIS — M40204 Unspecified kyphosis, thoracic region: Secondary | ICD-10-CM | POA: Diagnosis not present

## 2018-11-18 DIAGNOSIS — Z7982 Long term (current) use of aspirin: Secondary | ICD-10-CM | POA: Diagnosis not present

## 2018-11-18 DIAGNOSIS — G894 Chronic pain syndrome: Secondary | ICD-10-CM | POA: Diagnosis not present

## 2018-11-18 DIAGNOSIS — G309 Alzheimer's disease, unspecified: Secondary | ICD-10-CM | POA: Diagnosis not present

## 2018-11-18 DIAGNOSIS — R296 Repeated falls: Secondary | ICD-10-CM | POA: Diagnosis not present

## 2018-11-18 DIAGNOSIS — E039 Hypothyroidism, unspecified: Secondary | ICD-10-CM | POA: Diagnosis not present

## 2018-11-18 DIAGNOSIS — F0281 Dementia in other diseases classified elsewhere with behavioral disturbance: Secondary | ICD-10-CM | POA: Diagnosis not present

## 2018-11-18 DIAGNOSIS — Z8673 Personal history of transient ischemic attack (TIA), and cerebral infarction without residual deficits: Secondary | ICD-10-CM | POA: Diagnosis not present

## 2018-11-18 DIAGNOSIS — M5136 Other intervertebral disc degeneration, lumbar region: Secondary | ICD-10-CM | POA: Diagnosis not present

## 2018-11-27 ENCOUNTER — Telehealth: Payer: Self-pay | Admitting: Neurology

## 2018-11-27 MED ORDER — DIVALPROEX SODIUM ER 250 MG PO TB24: 250.0000 mg | ORAL_TABLET | Freq: Every day | ORAL | 11 refills | Status: AC

## 2018-11-27 MED ORDER — MEMANTINE HCL ER 28 MG PO CP24: 28.0000 mg | ORAL_CAPSULE | Freq: Every day | ORAL | 3 refills | Status: AC

## 2018-11-27 NOTE — Telephone Encounter (Signed)
Please call patient, stop donepezil, will decrease depakote ER 250mg  qhs. New Rx was called in.

## 2018-11-27 NOTE — Addendum Note (Signed)
Addended by: Noberto Retort C on: 11/27/2018 02:02 PM   Modules accepted: Orders

## 2018-11-27 NOTE — Telephone Encounter (Signed)
The patient needs the prescription sent in for 90-days in order for her co-pay to be more affordable.  Per vo by Dr. Krista Blue, okay to send in 90-day rx for memantine ER, 28mg 

## 2018-11-27 NOTE — Telephone Encounter (Signed)
Pt's daughter states that the divalproex (DEPAKOTE ER) 500 MG 24 hr tablet that was given to the pt turns her into a Zombie and the donepezil (ARICEPT) 10 MG tablet makes her vomit. Please advise.

## 2018-11-27 NOTE — Telephone Encounter (Signed)
I spoke to the patient's daughter who verbalized understanding of Dr.Yan's orders below.  She would like her mother to continue taking memantine ER 28mg  once daily.  She is requesting Dr. Krista Blue to manage these refills.

## 2018-12-05 DIAGNOSIS — M419 Scoliosis, unspecified: Secondary | ICD-10-CM | POA: Diagnosis not present

## 2018-12-05 DIAGNOSIS — R296 Repeated falls: Secondary | ICD-10-CM | POA: Diagnosis not present

## 2018-12-05 DIAGNOSIS — Z7982 Long term (current) use of aspirin: Secondary | ICD-10-CM | POA: Diagnosis not present

## 2018-12-05 DIAGNOSIS — F0281 Dementia in other diseases classified elsewhere with behavioral disturbance: Secondary | ICD-10-CM | POA: Diagnosis not present

## 2018-12-05 DIAGNOSIS — M5136 Other intervertebral disc degeneration, lumbar region: Secondary | ICD-10-CM | POA: Diagnosis not present

## 2018-12-05 DIAGNOSIS — F329 Major depressive disorder, single episode, unspecified: Secondary | ICD-10-CM | POA: Diagnosis not present

## 2018-12-05 DIAGNOSIS — M40204 Unspecified kyphosis, thoracic region: Secondary | ICD-10-CM | POA: Diagnosis not present

## 2018-12-05 DIAGNOSIS — E559 Vitamin D deficiency, unspecified: Secondary | ICD-10-CM | POA: Diagnosis not present

## 2018-12-05 DIAGNOSIS — G309 Alzheimer's disease, unspecified: Secondary | ICD-10-CM | POA: Diagnosis not present

## 2018-12-05 DIAGNOSIS — E039 Hypothyroidism, unspecified: Secondary | ICD-10-CM | POA: Diagnosis not present

## 2018-12-05 DIAGNOSIS — G894 Chronic pain syndrome: Secondary | ICD-10-CM | POA: Diagnosis not present

## 2018-12-05 DIAGNOSIS — Z8673 Personal history of transient ischemic attack (TIA), and cerebral infarction without residual deficits: Secondary | ICD-10-CM | POA: Diagnosis not present

## 2018-12-05 DIAGNOSIS — F17210 Nicotine dependence, cigarettes, uncomplicated: Secondary | ICD-10-CM | POA: Diagnosis not present

## 2018-12-09 DIAGNOSIS — R269 Unspecified abnormalities of gait and mobility: Secondary | ICD-10-CM | POA: Diagnosis not present

## 2018-12-11 ENCOUNTER — Telehealth: Payer: Self-pay

## 2018-12-11 NOTE — Telephone Encounter (Signed)
LVM to schedule EEG 

## 2018-12-27 DIAGNOSIS — F324 Major depressive disorder, single episode, in partial remission: Secondary | ICD-10-CM | POA: Diagnosis not present

## 2018-12-27 DIAGNOSIS — F0391 Unspecified dementia with behavioral disturbance: Secondary | ICD-10-CM | POA: Diagnosis not present

## 2019-02-18 DEATH — deceased
